# Patient Record
Sex: Female | Born: 1980
Health system: Southern US, Community
[De-identification: ages and names within clinical notes are randomized; demographics above are authoritative.]

## PROBLEM LIST (undated history)

## (undated) DIAGNOSIS — R209 Unspecified disturbances of skin sensation: Secondary | ICD-10-CM

## (undated) DIAGNOSIS — Z973 Presence of spectacles and contact lenses: Secondary | ICD-10-CM

## (undated) DIAGNOSIS — Z8679 Personal history of other diseases of the circulatory system: Secondary | ICD-10-CM

## (undated) DIAGNOSIS — Z8719 Personal history of other diseases of the digestive system: Secondary | ICD-10-CM

## (undated) DIAGNOSIS — K602 Anal fissure, unspecified: Secondary | ICD-10-CM

## (undated) DIAGNOSIS — K219 Gastro-esophageal reflux disease without esophagitis: Secondary | ICD-10-CM

## (undated) DIAGNOSIS — R0600 Dyspnea, unspecified: Secondary | ICD-10-CM

## (undated) DIAGNOSIS — R531 Weakness: Secondary | ICD-10-CM

## (undated) DIAGNOSIS — S5400XA Injury of ulnar nerve at forearm level, unspecified arm, initial encounter: Secondary | ICD-10-CM

## (undated) DIAGNOSIS — M545 Low back pain, unspecified: Secondary | ICD-10-CM

## (undated) DIAGNOSIS — F419 Anxiety disorder, unspecified: Secondary | ICD-10-CM

## (undated) DIAGNOSIS — F329 Major depressive disorder, single episode, unspecified: Secondary | ICD-10-CM

## (undated) DIAGNOSIS — Z8781 Personal history of (healed) traumatic fracture: Secondary | ICD-10-CM

## (undated) DIAGNOSIS — K649 Unspecified hemorrhoids: Secondary | ICD-10-CM

## (undated) DIAGNOSIS — F32A Depression, unspecified: Secondary | ICD-10-CM

## (undated) DIAGNOSIS — S62109A Fracture of unspecified carpal bone, unspecified wrist, initial encounter for closed fracture: Secondary | ICD-10-CM

## (undated) DIAGNOSIS — G43909 Migraine, unspecified, not intractable, without status migrainosus: Secondary | ICD-10-CM

## (undated) HISTORY — DX: Depression, unspecified: F32.A

## (undated) HISTORY — DX: Migraine, unspecified, not intractable, without status migrainosus: G43.909

## (undated) HISTORY — DX: Major depressive disorder, single episode, unspecified: F32.9

## (undated) HISTORY — PX: FRACTURE SURGERY: SHX138

## (undated) HISTORY — DX: Anxiety disorder, unspecified: F41.9

## (undated) HISTORY — PX: OTHER SURGICAL HISTORY: SHX169

---
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
## 2004-07-04 ENCOUNTER — Emergency Department (HOSPITAL_COMMUNITY): Admission: EM | Admit: 2004-07-04 | Discharge: 2004-07-04 | Payer: Self-pay | Admitting: *Deleted

## 2007-02-06 ENCOUNTER — Emergency Department (HOSPITAL_COMMUNITY): Admission: EM | Admit: 2007-02-06 | Discharge: 2007-02-06 | Payer: Self-pay | Admitting: Emergency Medicine

## 2008-04-04 ENCOUNTER — Ambulatory Visit: Payer: Self-pay | Admitting: Sports Medicine

## 2008-04-04 DIAGNOSIS — IMO0002 Reserved for concepts with insufficient information to code with codable children: Secondary | ICD-10-CM | POA: Insufficient documentation

## 2008-04-04 DIAGNOSIS — M25579 Pain in unspecified ankle and joints of unspecified foot: Secondary | ICD-10-CM | POA: Insufficient documentation

## 2008-12-21 ENCOUNTER — Emergency Department (HOSPITAL_COMMUNITY): Admission: EM | Admit: 2008-12-21 | Discharge: 2008-12-21 | Payer: Self-pay | Admitting: Family Medicine

## 2013-03-17 ENCOUNTER — Other Ambulatory Visit: Payer: Self-pay | Admitting: Family Medicine

## 2013-03-17 ENCOUNTER — Other Ambulatory Visit (HOSPITAL_COMMUNITY)
Admission: RE | Admit: 2013-03-17 | Discharge: 2013-03-17 | Disposition: A | Discharge status: Home / Self Care | Payer: BC Managed Care – PPO | Source: Ambulatory Visit | Attending: Family Medicine | Admitting: Family Medicine

## 2013-03-17 DIAGNOSIS — Z113 Encounter for screening for infections with a predominantly sexual mode of transmission: Secondary | ICD-10-CM | POA: Insufficient documentation

## 2013-03-17 DIAGNOSIS — Z124 Encounter for screening for malignant neoplasm of cervix: Secondary | ICD-10-CM | POA: Insufficient documentation

## 2014-03-01 ENCOUNTER — Ambulatory Visit (INDEPENDENT_AMBULATORY_CARE_PROVIDER_SITE_OTHER): Payer: BC Managed Care – PPO | Admitting: Neurology

## 2014-03-01 ENCOUNTER — Ambulatory Visit (INDEPENDENT_AMBULATORY_CARE_PROVIDER_SITE_OTHER): Payer: Self-pay

## 2014-03-01 DIAGNOSIS — R209 Unspecified disturbances of skin sensation: Secondary | ICD-10-CM

## 2014-03-01 DIAGNOSIS — R202 Paresthesia of skin: Secondary | ICD-10-CM

## 2014-03-01 DIAGNOSIS — IMO0002 Reserved for concepts with insufficient information to code with codable children: Secondary | ICD-10-CM

## 2014-03-01 DIAGNOSIS — Z0289 Encounter for other administrative examinations: Secondary | ICD-10-CM

## 2014-03-01 NOTE — Procedures (Signed)
   NCS (NERVE CONDUCTION STUDY) WITH EMG (ELECTROMYOGRAPHY) REPORT   STUDY DATE: July 2nd 2015 PATIENT NAME: Shirley Webb DOB: [birth year] MRN: [id number]    TECHNOLOGIST: Laretta Alstrom ELECTROMYOGRAPHER: Marcial Pacas M.D.  CLINICAL INFORMATION:  33 years old right-handed Caucasian female, presenting with a year history of left fourth and fifth fingers intermittent paresthesia, left elbow discomfort, subjective weakness of her right hand, recent few months, she also complains of left-sided neck pain, left shoulder discomfort  On examination: Bilateral upper extremity motor strength was normal including grip, abductor pollicis brevis, finger abduction, finger flexion. deep tendon reflex were normal and symmetric. She has left elbow Tinel sign   FINDINGS: NERVE CONDUCTION STUDY: Bilateral median, ulnar sensory and motor responses were normal  NEEDLE ELECTROMYOGRAPHY: Selected needle examination was performed at left upper extremity muscles, left cervical paraspinal muscles.  Needle examination of left pronator teres, flexor carpi ulnaris, biceps, triceps, deltoid, extensor digital communis, brachial radialis, first dorsal interossei was normal  There was no spontaneous activity at left cervical paraspinal muscles, left C5, 6, 7  IMPRESSION:   This is a normal study. There was no electrodiagnostic evidence of upper extremity neuropathy, or left cervical radiculopathy   INTERPRETING PHYSICIAN:   Marcial Pacas M.D. Ph.D. [facility] [address], [place], [place] [address] [phone]

## 2015-02-28 ENCOUNTER — Other Ambulatory Visit: Payer: Self-pay | Admitting: Registered Nurse

## 2015-02-28 ENCOUNTER — Other Ambulatory Visit (HOSPITAL_COMMUNITY)
Admission: RE | Admit: 2015-02-28 | Discharge: 2015-02-28 | Disposition: A | Discharge status: Home / Self Care | Payer: BLUE CROSS/BLUE SHIELD | Source: Ambulatory Visit | Attending: Internal Medicine | Admitting: Internal Medicine

## 2015-02-28 DIAGNOSIS — Z01419 Encounter for gynecological examination (general) (routine) without abnormal findings: Secondary | ICD-10-CM | POA: Insufficient documentation

## 2015-03-05 LAB — CYTOLOGY - PAP

## 2015-12-02 DIAGNOSIS — Z3046 Encounter for surveillance of implantable subdermal contraceptive: Secondary | ICD-10-CM | POA: Diagnosis not present

## 2015-12-18 DIAGNOSIS — M9904 Segmental and somatic dysfunction of sacral region: Secondary | ICD-10-CM | POA: Diagnosis not present

## 2015-12-18 DIAGNOSIS — M9903 Segmental and somatic dysfunction of lumbar region: Secondary | ICD-10-CM | POA: Diagnosis not present

## 2015-12-18 DIAGNOSIS — M5136 Other intervertebral disc degeneration, lumbar region: Secondary | ICD-10-CM | POA: Diagnosis not present

## 2016-01-07 DIAGNOSIS — Z30017 Encounter for initial prescription of implantable subdermal contraceptive: Secondary | ICD-10-CM | POA: Diagnosis not present

## 2016-01-07 DIAGNOSIS — Z3046 Encounter for surveillance of implantable subdermal contraceptive: Secondary | ICD-10-CM | POA: Diagnosis not present

## 2016-01-28 DIAGNOSIS — M9903 Segmental and somatic dysfunction of lumbar region: Secondary | ICD-10-CM | POA: Diagnosis not present

## 2016-01-28 DIAGNOSIS — M531 Cervicobrachial syndrome: Secondary | ICD-10-CM | POA: Diagnosis not present

## 2016-01-28 DIAGNOSIS — M5136 Other intervertebral disc degeneration, lumbar region: Secondary | ICD-10-CM | POA: Diagnosis not present

## 2016-02-28 DIAGNOSIS — E559 Vitamin D deficiency, unspecified: Secondary | ICD-10-CM | POA: Diagnosis not present

## 2016-02-28 DIAGNOSIS — Z0001 Encounter for general adult medical examination with abnormal findings: Secondary | ICD-10-CM | POA: Diagnosis not present

## 2016-03-12 DIAGNOSIS — B373 Candidiasis of vulva and vagina: Secondary | ICD-10-CM | POA: Diagnosis not present

## 2016-03-12 DIAGNOSIS — G43909 Migraine, unspecified, not intractable, without status migrainosus: Secondary | ICD-10-CM | POA: Diagnosis not present

## 2016-03-12 DIAGNOSIS — F902 Attention-deficit hyperactivity disorder, combined type: Secondary | ICD-10-CM | POA: Diagnosis not present

## 2016-03-12 DIAGNOSIS — Z0000 Encounter for general adult medical examination without abnormal findings: Secondary | ICD-10-CM | POA: Diagnosis not present

## 2016-03-16 DIAGNOSIS — M9904 Segmental and somatic dysfunction of sacral region: Secondary | ICD-10-CM | POA: Diagnosis not present

## 2016-03-16 DIAGNOSIS — M531 Cervicobrachial syndrome: Secondary | ICD-10-CM | POA: Diagnosis not present

## 2016-03-16 DIAGNOSIS — M5136 Other intervertebral disc degeneration, lumbar region: Secondary | ICD-10-CM | POA: Diagnosis not present

## 2016-05-06 DIAGNOSIS — G43709 Chronic migraine without aura, not intractable, without status migrainosus: Secondary | ICD-10-CM | POA: Diagnosis not present

## 2016-08-14 DIAGNOSIS — M542 Cervicalgia: Secondary | ICD-10-CM | POA: Diagnosis not present

## 2016-08-14 DIAGNOSIS — F329 Major depressive disorder, single episode, unspecified: Secondary | ICD-10-CM | POA: Diagnosis not present

## 2016-08-14 DIAGNOSIS — G43709 Chronic migraine without aura, not intractable, without status migrainosus: Secondary | ICD-10-CM | POA: Diagnosis not present

## 2016-08-26 DIAGNOSIS — G43909 Migraine, unspecified, not intractable, without status migrainosus: Secondary | ICD-10-CM | POA: Diagnosis not present

## 2016-09-04 DIAGNOSIS — G43909 Migraine, unspecified, not intractable, without status migrainosus: Secondary | ICD-10-CM | POA: Diagnosis not present

## 2016-09-11 DIAGNOSIS — G43909 Migraine, unspecified, not intractable, without status migrainosus: Secondary | ICD-10-CM | POA: Diagnosis not present

## 2016-09-15 DIAGNOSIS — G43909 Migraine, unspecified, not intractable, without status migrainosus: Secondary | ICD-10-CM | POA: Diagnosis not present

## 2016-09-25 DIAGNOSIS — G43909 Migraine, unspecified, not intractable, without status migrainosus: Secondary | ICD-10-CM | POA: Diagnosis not present

## 2016-10-02 DIAGNOSIS — G43909 Migraine, unspecified, not intractable, without status migrainosus: Secondary | ICD-10-CM | POA: Diagnosis not present

## 2016-10-06 DIAGNOSIS — G43909 Migraine, unspecified, not intractable, without status migrainosus: Secondary | ICD-10-CM | POA: Diagnosis not present

## 2016-10-08 DIAGNOSIS — F902 Attention-deficit hyperactivity disorder, combined type: Secondary | ICD-10-CM | POA: Diagnosis not present

## 2016-10-19 DIAGNOSIS — G43909 Migraine, unspecified, not intractable, without status migrainosus: Secondary | ICD-10-CM | POA: Diagnosis not present

## 2016-11-13 DIAGNOSIS — G43909 Migraine, unspecified, not intractable, without status migrainosus: Secondary | ICD-10-CM | POA: Diagnosis not present

## 2016-11-20 DIAGNOSIS — G43909 Migraine, unspecified, not intractable, without status migrainosus: Secondary | ICD-10-CM | POA: Diagnosis not present

## 2016-11-27 DIAGNOSIS — G43909 Migraine, unspecified, not intractable, without status migrainosus: Secondary | ICD-10-CM | POA: Diagnosis not present

## 2016-12-18 DIAGNOSIS — G4700 Insomnia, unspecified: Secondary | ICD-10-CM | POA: Diagnosis not present

## 2016-12-18 DIAGNOSIS — F902 Attention-deficit hyperactivity disorder, combined type: Secondary | ICD-10-CM | POA: Diagnosis not present

## 2017-01-03 ENCOUNTER — Observation Stay (HOSPITAL_COMMUNITY)
Admission: EM | Admit: 2017-01-03 | Discharge: 2017-01-04 | Disposition: A | Discharge status: Home / Self Care | Payer: BLUE CROSS/BLUE SHIELD | Attending: Surgery | Admitting: Surgery

## 2017-01-03 ENCOUNTER — Emergency Department (HOSPITAL_COMMUNITY): Payer: BLUE CROSS/BLUE SHIELD

## 2017-01-03 ENCOUNTER — Encounter (HOSPITAL_COMMUNITY): Payer: Self-pay | Admitting: Surgery

## 2017-01-03 ENCOUNTER — Emergency Department (HOSPITAL_COMMUNITY): Payer: BLUE CROSS/BLUE SHIELD | Admitting: Anesthesiology

## 2017-01-03 ENCOUNTER — Encounter (HOSPITAL_COMMUNITY)
Admission: EM | Disposition: A | Discharge status: Home / Self Care | Payer: Self-pay | Source: Home / Self Care | Attending: Emergency Medicine

## 2017-01-03 DIAGNOSIS — Z882 Allergy status to sulfonamides status: Secondary | ICD-10-CM | POA: Diagnosis not present

## 2017-01-03 DIAGNOSIS — Z91040 Latex allergy status: Secondary | ICD-10-CM | POA: Diagnosis not present

## 2017-01-03 DIAGNOSIS — R1031 Right lower quadrant pain: Secondary | ICD-10-CM | POA: Diagnosis not present

## 2017-01-03 DIAGNOSIS — R509 Fever, unspecified: Secondary | ICD-10-CM | POA: Diagnosis not present

## 2017-01-03 DIAGNOSIS — K3580 Unspecified acute appendicitis: Principal | ICD-10-CM | POA: Diagnosis present

## 2017-01-03 DIAGNOSIS — R109 Unspecified abdominal pain: Secondary | ICD-10-CM | POA: Diagnosis not present

## 2017-01-03 DIAGNOSIS — K353 Acute appendicitis with localized peritonitis: Secondary | ICD-10-CM | POA: Insufficient documentation

## 2017-01-03 DIAGNOSIS — K3589 Other acute appendicitis without perforation or gangrene: Secondary | ICD-10-CM

## 2017-01-03 DIAGNOSIS — M792 Neuralgia and neuritis, unspecified: Secondary | ICD-10-CM | POA: Diagnosis not present

## 2017-01-03 HISTORY — PX: LAPAROSCOPIC APPENDECTOMY: SHX408

## 2017-01-03 LAB — COMPREHENSIVE METABOLIC PANEL
ALBUMIN: 4.1 g/dL (ref 3.5–5.0)
ALK PHOS: 50 U/L (ref 38–126)
ALT: 13 U/L — ABNORMAL LOW (ref 14–54)
AST: 17 U/L (ref 15–41)
Anion gap: 9 (ref 5–15)
BILIRUBIN TOTAL: 0.8 mg/dL (ref 0.3–1.2)
BUN: 6 mg/dL (ref 6–20)
CALCIUM: 9.1 mg/dL (ref 8.9–10.3)
CO2: 23 mmol/L (ref 22–32)
CREATININE: 0.58 mg/dL (ref 0.44–1.00)
Chloride: 104 mmol/L (ref 101–111)
GFR calc Af Amer: >60 mL/min (ref >60)
GFR calc non Af Amer: >60 mL/min (ref >60)
GLUCOSE: 128 mg/dL — AB (ref 65–99)
Potassium: 3.8 mmol/L (ref 3.5–5.1)
Sodium: 136 mmol/L (ref 135–145)
TOTAL PROTEIN: 7.2 g/dL (ref 6.5–8.1)

## 2017-01-03 LAB — CBC
HEMATOCRIT: 39.9 % (ref 36.0–46.0)
HEMOGLOBIN: 13.5 g/dL (ref 12.0–15.0)
MCH: 30.8 pg (ref 26.0–34.0)
MCHC: 33.8 g/dL (ref 30.0–36.0)
MCV: 91.1 fL (ref 78.0–100.0)
Platelets: 249 10*3/uL (ref 150–400)
RBC: 4.38 MIL/uL (ref 3.87–5.11)
RDW: 12.8 % (ref 11.5–15.5)
WBC: 15.4 10*3/uL — AB (ref 4.0–10.5)

## 2017-01-03 LAB — URINALYSIS, ROUTINE W REFLEX MICROSCOPIC
Bilirubin Urine: NEGATIVE
GLUCOSE, UA: NEGATIVE mg/dL
Hgb urine dipstick: NEGATIVE
KETONES UR: 80 mg/dL — AB
LEUKOCYTES UA: NEGATIVE
NITRITE: NEGATIVE
PH: 6 (ref 5.0–8.0)
Protein, ur: NEGATIVE mg/dL
SPECIFIC GRAVITY, URINE: 1.021 (ref 1.005–1.030)

## 2017-01-03 LAB — PREGNANCY, URINE: Preg Test, Ur: NEGATIVE

## 2017-01-03 LAB — LIPASE, BLOOD: Lipase: 21 U/L (ref 11–51)

## 2017-01-03 SURGERY — APPENDECTOMY, LAPAROSCOPIC
Anesthesia: General | Site: Abdomen

## 2017-01-03 MED ORDER — HYDROMORPHONE HCL 1 MG/ML IJ SOLN: 0.2500 mg | INTRAMUSCULAR | Status: DC | PRN

## 2017-01-03 MED ORDER — SCOPOLAMINE 1 MG/3DAYS TD PT72
MEDICATED_PATCH | TRANSDERMAL | Status: AC
  Filled 2017-01-03: qty 1

## 2017-01-03 MED ORDER — ONDANSETRON HCL 4 MG/2ML IJ SOLN
INTRAMUSCULAR | Status: AC
Start: 2017-01-03 — End: 2017-01-03
  Filled 2017-01-03: qty 2

## 2017-01-03 MED ORDER — HYDROMORPHONE HCL 1 MG/ML IJ SOLN
1.0000 mg | INTRAMUSCULAR | Status: DC | PRN
  Administered 2017-01-03: 1 mg via INTRAVENOUS
  Filled 2017-01-03: qty 1

## 2017-01-03 MED ORDER — 0.9 % SODIUM CHLORIDE (POUR BTL) OPTIME
TOPICAL | Status: DC | PRN
  Administered 2017-01-03: 1000 mL

## 2017-01-03 MED ORDER — DEXAMETHASONE SODIUM PHOSPHATE 10 MG/ML IJ SOLN
INTRAMUSCULAR | Status: DC | PRN
  Administered 2017-01-03: 10 mg via INTRAVENOUS

## 2017-01-03 MED ORDER — TRAZODONE HCL 50 MG PO TABS
50.0000 mg | ORAL_TABLET | Freq: Every day | ORAL | Status: DC
  Administered 2017-01-03: 50 mg via ORAL
  Filled 2017-01-03: qty 1

## 2017-01-03 MED ORDER — LACTATED RINGERS IR SOLN
Status: DC | PRN
  Administered 2017-01-03: 3000 mL

## 2017-01-03 MED ORDER — FENTANYL CITRATE (PF) 250 MCG/5ML IJ SOLN
INTRAMUSCULAR | Status: AC
  Filled 2017-01-03: qty 5

## 2017-01-03 MED ORDER — IMIPRAMINE HCL 50 MG PO TABS
75.0000 mg | ORAL_TABLET | Freq: Every day | ORAL | Status: DC
  Administered 2017-01-03: 75 mg via ORAL
  Filled 2017-01-03: qty 1

## 2017-01-03 MED ORDER — MORPHINE SULFATE (PF) 4 MG/ML IV SOLN
4.0000 mg | Freq: Once | INTRAVENOUS | Status: AC
  Administered 2017-01-03: 4 mg via INTRAVENOUS
  Filled 2017-01-03: qty 1

## 2017-01-03 MED ORDER — SODIUM CHLORIDE 0.9 % IV BOLUS (SEPSIS)
1000.0000 mL | Freq: Once | INTRAVENOUS | Status: AC
  Administered 2017-01-03: 1000 mL via INTRAVENOUS

## 2017-01-03 MED ORDER — BUPIVACAINE HCL (PF) 0.25 % IJ SOLN
INTRAMUSCULAR | Status: DC | PRN
  Administered 2017-01-03: 20 mL

## 2017-01-03 MED ORDER — LACTATED RINGERS IV SOLN
INTRAVENOUS | Status: DC | PRN
  Administered 2017-01-03: 11:00:00 via INTRAVENOUS

## 2017-01-03 MED ORDER — PROPOFOL 10 MG/ML IV BOLUS
INTRAVENOUS | Status: AC
  Filled 2017-01-03: qty 20

## 2017-01-03 MED ORDER — HYDROCODONE-ACETAMINOPHEN 5-325 MG PO TABS
1.0000 | ORAL_TABLET | ORAL | Status: DC | PRN
Start: 2017-01-03 — End: 2017-01-04

## 2017-01-03 MED ORDER — MIDAZOLAM HCL 2 MG/2ML IJ SOLN
INTRAMUSCULAR | Status: AC
Start: 2017-01-03 — End: 2017-01-03
  Filled 2017-01-03: qty 2

## 2017-01-03 MED ORDER — MIDAZOLAM HCL 5 MG/5ML IJ SOLN
INTRAMUSCULAR | Status: DC | PRN
  Administered 2017-01-03: 2 mg via INTRAVENOUS

## 2017-01-03 MED ORDER — ONDANSETRON HCL 4 MG/2ML IJ SOLN
4.0000 mg | Freq: Once | INTRAMUSCULAR | Status: AC
  Administered 2017-01-03: 4 mg via INTRAVENOUS
  Filled 2017-01-03: qty 2

## 2017-01-03 MED ORDER — METRONIDAZOLE IN NACL 5-0.79 MG/ML-% IV SOLN
500.0000 mg | Freq: Once | INTRAVENOUS | Status: AC
  Administered 2017-01-03: 500 mg via INTRAVENOUS
  Filled 2017-01-03: qty 100

## 2017-01-03 MED ORDER — PROPOFOL 10 MG/ML IV BOLUS
INTRAVENOUS | Status: DC | PRN
Start: 2017-01-03 — End: 2017-01-03
  Administered 2017-01-03: 140 mg via INTRAVENOUS

## 2017-01-03 MED ORDER — LIDOCAINE 2% (20 MG/ML) 5 ML SYRINGE
INTRAMUSCULAR | Status: AC
  Filled 2017-01-03: qty 5

## 2017-01-03 MED ORDER — SUGAMMADEX SODIUM 200 MG/2ML IV SOLN
INTRAVENOUS | Status: DC | PRN
  Administered 2017-01-03: 110 mg via INTRAVENOUS

## 2017-01-03 MED ORDER — HYDROMORPHONE HCL 1 MG/ML IJ SOLN
INTRAMUSCULAR | Status: DC | PRN
  Administered 2017-01-03: 0.5 mg via INTRAVENOUS
  Administered 2017-01-03: 1 mg via INTRAVENOUS
  Administered 2017-01-03: 0.5 mg via INTRAVENOUS

## 2017-01-03 MED ORDER — ONDANSETRON HCL 4 MG/2ML IJ SOLN: 4.0000 mg | Freq: Four times a day (QID) | INTRAMUSCULAR | Status: DC | PRN

## 2017-01-03 MED ORDER — LIDOCAINE 2% (20 MG/ML) 5 ML SYRINGE
INTRAMUSCULAR | Status: DC | PRN
  Administered 2017-01-03: 50 mg via INTRAVENOUS

## 2017-01-03 MED ORDER — ACETAMINOPHEN 325 MG PO TABS
650.0000 mg | ORAL_TABLET | Freq: Four times a day (QID) | ORAL | Status: DC | PRN
  Administered 2017-01-04 (×2): 650 mg via ORAL
  Filled 2017-01-03 (×2): qty 2

## 2017-01-03 MED ORDER — DEXTROSE 5 % IV SOLN
2.0000 g | INTRAVENOUS | Status: DC
  Administered 2017-01-04: 2 g via INTRAVENOUS
  Filled 2017-01-03: qty 2

## 2017-01-03 MED ORDER — ROCURONIUM BROMIDE 50 MG/5ML IV SOSY
PREFILLED_SYRINGE | INTRAVENOUS | Status: AC
  Filled 2017-01-03: qty 5

## 2017-01-03 MED ORDER — KCL IN DEXTROSE-NACL 20-5-0.45 MEQ/L-%-% IV SOLN
INTRAVENOUS | Status: DC
  Administered 2017-01-03: 14:00:00 via INTRAVENOUS
  Filled 2017-01-03: qty 1000

## 2017-01-03 MED ORDER — DEXAMETHASONE SODIUM PHOSPHATE 10 MG/ML IJ SOLN
INTRAMUSCULAR | Status: AC
  Filled 2017-01-03: qty 1

## 2017-01-03 MED ORDER — FENTANYL CITRATE (PF) 100 MCG/2ML IJ SOLN
INTRAMUSCULAR | Status: DC | PRN
  Administered 2017-01-03: 100 ug via INTRAVENOUS
  Administered 2017-01-03 (×3): 50 ug via INTRAVENOUS

## 2017-01-03 MED ORDER — BUPIVACAINE HCL (PF) 0.25 % IJ SOLN
INTRAMUSCULAR | Status: AC
  Filled 2017-01-03: qty 30

## 2017-01-03 MED ORDER — ACETAMINOPHEN 650 MG RE SUPP: 650.0000 mg | Freq: Four times a day (QID) | RECTAL | Status: DC | PRN

## 2017-01-03 MED ORDER — ROCURONIUM BROMIDE 10 MG/ML (PF) SYRINGE
PREFILLED_SYRINGE | INTRAVENOUS | Status: DC | PRN
  Administered 2017-01-03: 30 mg via INTRAVENOUS

## 2017-01-03 MED ORDER — HYDROMORPHONE HCL 2 MG/ML IJ SOLN
INTRAMUSCULAR | Status: AC
  Filled 2017-01-03: qty 1

## 2017-01-03 MED ORDER — CHLORHEXIDINE GLUCONATE CLOTH 2 % EX PADS: 6.0000 | MEDICATED_PAD | Freq: Once | CUTANEOUS | Status: DC

## 2017-01-03 MED ORDER — SUCCINYLCHOLINE CHLORIDE 200 MG/10ML IV SOSY
PREFILLED_SYRINGE | INTRAVENOUS | Status: AC
  Filled 2017-01-03: qty 10

## 2017-01-03 MED ORDER — SUCCINYLCHOLINE CHLORIDE 200 MG/10ML IV SOSY
PREFILLED_SYRINGE | INTRAVENOUS | Status: DC | PRN
  Administered 2017-01-03: 100 mg via INTRAVENOUS

## 2017-01-03 MED ORDER — PROMETHAZINE HCL 25 MG/ML IJ SOLN: 6.2500 mg | INTRAMUSCULAR | Status: DC | PRN

## 2017-01-03 MED ORDER — ONDANSETRON 4 MG PO TBDP: 4.0000 mg | ORAL_TABLET | Freq: Four times a day (QID) | ORAL | Status: DC | PRN

## 2017-01-03 MED ORDER — CEFTRIAXONE SODIUM 2 G IJ SOLR
2.0000 g | Freq: Once | INTRAMUSCULAR | Status: AC
  Administered 2017-01-03: 2 g via INTRAVENOUS
  Filled 2017-01-03: qty 2

## 2017-01-03 MED ORDER — ONDANSETRON HCL 4 MG/2ML IJ SOLN
INTRAMUSCULAR | Status: DC | PRN
  Administered 2017-01-03: 4 mg via INTRAVENOUS

## 2017-01-03 MED ORDER — IOPAMIDOL (ISOVUE-300) INJECTION 61%
INTRAVENOUS | Status: AC
  Administered 2017-01-03: 100 mL via INTRAVENOUS
  Filled 2017-01-03: qty 100

## 2017-01-03 MED ORDER — LACTATED RINGERS IV SOLN: INTRAVENOUS | Status: DC

## 2017-01-03 MED ORDER — KCL IN DEXTROSE-NACL 20-5-0.45 MEQ/L-%-% IV SOLN
INTRAVENOUS | Status: AC
  Filled 2017-01-03: qty 1000

## 2017-01-03 MED ORDER — METRONIDAZOLE IN NACL 5-0.79 MG/ML-% IV SOLN
500.0000 mg | Freq: Three times a day (TID) | INTRAVENOUS | Status: DC
  Administered 2017-01-03 – 2017-01-04 (×3): 500 mg via INTRAVENOUS
  Filled 2017-01-03 (×3): qty 100

## 2017-01-03 MED ORDER — SUGAMMADEX SODIUM 200 MG/2ML IV SOLN
INTRAVENOUS | Status: AC
  Filled 2017-01-03: qty 2

## 2017-01-03 SURGICAL SUPPLY — 37 items
APPLIER CLIP ROT 10 11.4 M/L (STAPLE)
APR CLP MED LRG 11.4X10 (STAPLE)
BAG SPEC RTRVL LRG 6X4 10 (ENDOMECHANICALS) ×1
CHLORAPREP W/TINT 26ML (MISCELLANEOUS) ×2 IMPLANT
CLIP APPLIE ROT 10 11.4 M/L (STAPLE) IMPLANT
CLOSURE STERI-STRIP 1/4X4 (GAUZE/BANDAGES/DRESSINGS) ×1 IMPLANT
COVER SURGICAL LIGHT HANDLE (MISCELLANEOUS) ×2 IMPLANT
CUTTER FLEX LINEAR 45M (STAPLE) ×1 IMPLANT
DECANTER SPIKE VIAL GLASS SM (MISCELLANEOUS) ×2 IMPLANT
DRAPE LAPAROSCOPIC ABDOMINAL (DRAPES) ×2 IMPLANT
ELECT REM PT RETURN 15FT ADLT (MISCELLANEOUS) ×2 IMPLANT
ENDOLOOP SUT PDS II  0 18 (SUTURE)
ENDOLOOP SUT PDS II 0 18 (SUTURE) IMPLANT
GAUZE SPONGE 2X2 8PLY STRL LF (GAUZE/BANDAGES/DRESSINGS) IMPLANT
GLOVE SURG ORTHO 8.0 STRL STRW (GLOVE) ×2 IMPLANT
GOWN STRL REUS W/TWL XL LVL3 (GOWN DISPOSABLE) ×4 IMPLANT
IRRIG SUCT STRYKERFLOW 2 WTIP (MISCELLANEOUS) ×2
IRRIGATION SUCT STRKRFLW 2 WTP (MISCELLANEOUS) ×1 IMPLANT
KIT BASIN OR (CUSTOM PROCEDURE TRAY) ×2 IMPLANT
POUCH SPECIMEN RETRIEVAL 10MM (ENDOMECHANICALS) ×2 IMPLANT
RELOAD 45 VASCULAR/THIN (ENDOMECHANICALS) IMPLANT
RELOAD STAPLE 45 2.5 WHT GRN (ENDOMECHANICALS) IMPLANT
RELOAD STAPLE 45 3.5 BLU ETS (ENDOMECHANICALS) IMPLANT
RELOAD STAPLE TA45 3.5 REG BLU (ENDOMECHANICALS) ×2 IMPLANT
SHEARS HARMONIC ACE PLUS 36CM (ENDOMECHANICALS) ×2 IMPLANT
SPONGE GAUZE 2X2 STER 10/PKG (GAUZE/BANDAGES/DRESSINGS) ×1
STRIP CLOSURE SKIN 1/2X4 (GAUZE/BANDAGES/DRESSINGS) ×2 IMPLANT
SUT MNCRL AB 4-0 PS2 18 (SUTURE) ×2 IMPLANT
TAPE CLOTH SURG 4X10 WHT LF (GAUZE/BANDAGES/DRESSINGS) ×1 IMPLANT
TOWEL OR 17X26 10 PK STRL BLUE (TOWEL DISPOSABLE) ×2 IMPLANT
TOWEL OR NON WOVEN STRL DISP B (DISPOSABLE) ×2 IMPLANT
TRAY FOLEY METER SIL LF 16FR (CATHETERS) ×1 IMPLANT
TRAY FOLEY W/METER SILVER 14FR (SET/KITS/TRAYS/PACK) IMPLANT
TRAY FOLEY W/METER SILVER 16FR (SET/KITS/TRAYS/PACK) IMPLANT
TRAY LAPAROSCOPIC (CUSTOM PROCEDURE TRAY) ×2 IMPLANT
TROCAR XCEL BLUNT TIP 100MML (ENDOMECHANICALS) ×2 IMPLANT
TROCAR XCEL NON-BLD 11X100MML (ENDOMECHANICALS) ×2 IMPLANT

## 2017-01-03 NOTE — ED Provider Notes (Signed)
[place] [facility] DEPT Provider Note   CSN: [id number] Arrival date & time: 01/03/17  0303     History   Chief Complaint No chief complaint on file.   HPI Shirley Webb is a 36 y.o. female who is previously healthy who presents with a 1 day history of lower abdominal pain. Patient reports her pain is the worst in the right, but also in the suprapubic area. Patient describes her pain as severe, like that menstrual cramps. However, patient states she does not get menstrual cycle due to her Nexplanon implant. Patient has had associated nausea, vomiting, and one episode of diarrhea. No known blood in her stools. Patient reports her pain was initially coming in waves, but now it is constant. Patient has taken Pepto-Bismol, Zantac, Excedrin at home without relief. Patient reports having a temperature of around 99 at home. Patient denies any chest pain, shortness of breath, vaginal discharge or bleeding, pelvic pain, urinary symptoms.  HPI  No past medical history on file.  Patient Active Problem List   Diagnosis Date Noted  . Paresthesia 03/01/2014  . ANKLE, PAIN 04/04/2008  . NEURALGIA 04/04/2008    No past surgical history on file.  OB History    No data available       Home Medications    Prior to Admission medications   Medication Sig Start Date End Date Taking? Authorizing Provider  aspirin-acetaminophen-caffeine (EXCEDRIN MIGRAINE) [phone] MG tablet Take 2 tablets by mouth every 6 (six) hours as needed for headache.   Yes [provider]  baclofen (LIORESAL) 10 MG tablet Take 10 mg by mouth as needed for migraine. 09/28/16 09/28/17 Yes [provider]  Calcium Carbonate-Vitamin D (CALCIUM 500 + D) 500-125 MG-UNIT TABS Take 1 tablet by mouth daily.   Yes [provider]  dextroamphetamine (DEXTROSTAT) 5 MG tablet Take 5 mg by mouth 2 (two) times daily.   Yes [provider]  diphenhydrAMINE (BENADRYL) 25 MG tablet Take 50 mg by mouth every  6 (six) hours as needed for sleep.   Yes [provider]  ELDERBERRY PO Take 5 mLs by mouth daily.   Yes [provider]  imipramine (TOFRANIL) 25 MG tablet Take 75 mg by mouth at bedtime. 10/19/16  Yes [provider]  Multiple Vitamin (MULTIVITAMIN) tablet Take 1 tablet by mouth daily.   Yes [provider]  Rhodiola rosea (RHODIOLA PO) Take 1 vial by mouth.   Yes [provider]  traZODone (DESYREL) 50 MG tablet Take 50-100 mg by mouth at bedtime.    Yes [provider]    Family History No family history on file.  Social History Social History  Substance Use Topics  . Smoking status: Not on file  . Smokeless tobacco: Not on file  . Alcohol use Not on file     Allergies   Sulfamethoxazole-trimethoprim and Latex   Review of Systems Review of Systems  Constitutional: Negative for chills and fever.  HENT: Negative for facial swelling and sore throat.   Respiratory: Negative for shortness of breath.   Cardiovascular: Negative for chest pain.  Gastrointestinal: Positive for abdominal pain, diarrhea, nausea and vomiting. Negative for blood in stool.  Genitourinary: Negative for dysuria.  Musculoskeletal: Negative for back pain.  Skin: Negative for rash and wound.  Neurological: Negative for headaches.  Psychiatric/Behavioral: The patient is not nervous/anxious.      Physical Exam Updated Vital Signs BP 124/73   Pulse 94   Temp 98 F (36.7 C) (Oral)  Resp 15   SpO2 100%   Physical Exam  Constitutional: She appears well-developed and well-nourished. No distress.  HENT:  Head: Normocephalic and atraumatic.  Mouth/Throat: Oropharynx is clear and moist. No oropharyngeal exudate.  Eyes: Conjunctivae are normal. Pupils are equal, round, and reactive to light. Right eye exhibits no discharge. Left eye exhibits no discharge. No scleral icterus.  Neck: Normal range of motion. Neck supple. No thyromegaly present.    Cardiovascular: Normal rate, regular rhythm, normal heart sounds and intact distal pulses.  Exam reveals no gallop and no friction rub.   No murmur heard. Pulmonary/Chest: Effort normal and breath sounds normal. No stridor. No respiratory distress. She has no wheezes. She has no rales.  Abdominal: Soft. Bowel sounds are normal. She exhibits no distension. There is generalized tenderness and tenderness in the right lower quadrant and suprapubic area. There is guarding and tenderness at McBurney's point. There is no rebound and no CVA tenderness.  Positive Rovsing sign  Musculoskeletal: She exhibits no edema.  Lymphadenopathy:    She has no cervical adenopathy.  Neurological: She is alert. Coordination normal.  Skin: Skin is warm and dry. No rash noted. She is not diaphoretic. No pallor.  Psychiatric: She has a normal mood and affect.  Nursing note and vitals reviewed.    ED Treatments / Results  Labs (all labs ordered are listed, but only abnormal results are displayed) Labs Reviewed  COMPREHENSIVE METABOLIC PANEL - Abnormal; Notable for the following:       Result Value   Glucose, Bld 128 (*)    ALT 13 (*)    All other components within normal limits  CBC - Abnormal; Notable for the following:    WBC 15.4 (*)    All other components within normal limits  URINALYSIS, ROUTINE W REFLEX MICROSCOPIC - Abnormal; Notable for the following:    Color, Urine AMBER (*)    APPearance HAZY (*)    Ketones, ur 80 (*)    All other components within normal limits  LIPASE, BLOOD  PREGNANCY, URINE    EKG  EKG Interpretation None       Radiology Ct Abdomen Pelvis W Contrast  Result Date: 01/03/2017 CLINICAL DATA:  Lower abdominal pain and tenderness. EXAM: CT ABDOMEN AND PELVIS WITH CONTRAST TECHNIQUE: Multidetector CT imaging of the abdomen and pelvis was performed using the standard protocol following bolus administration of intravenous contrast. CONTRAST:  138mL ISOVUE-300 IOPAMIDOL  (ISOVUE-300) INJECTION 61% COMPARISON:  None. FINDINGS: Lower chest: No acute abnormality. Hepatobiliary: No focal liver abnormality is seen. No gallstones, gallbladder wall thickening, or biliary dilatation. Pancreas: Unremarkable. No pancreatic ductal dilatation or surrounding inflammatory changes. Spleen: Normal in size without focal abnormality. Adrenals/Urinary Tract: Adrenal glands are unremarkable. Kidneys are normal, without renal calculi, focal lesion, or hydronephrosis. Bladder is unremarkable. Stomach/Bowel: There is evidence of acute appendicitis with a dilated and inflamed appendix present in the right pelvis emanating from the cecum and coursing posteriorly in the pelvis. The appendix contains 2 focal areas of internal partially calcified fecaliths. There is some associated small surrounding free fluid without evidence of focal abscess or visible extraluminal air. The appendix measures approximately 13 mm in greatest caliber. The wall demonstrates hyperemia. Other bowel loops in the abdomen and pelvis are unremarkable. Vascular/Lymphatic: No enlarged lymph nodes identified. No vascular abnormalities identified. Reproductive: Uterus and bilateral adnexa are unremarkable. Other: No hernias are seen. Musculoskeletal: No acute or significant osseous findings. IMPRESSION: Evidence of acute appendicitis with dilated an inflamed appendix measuring  approximately 13 mm in greatest caliber and containing 2 focal areas of partially calcified fecaliths. There is no evidence of overt appendiceal perforation or appendiceal abscess. Electronically Signed   By: Aletta Edouard M.D.   On: 01/03/2017 09:11    Procedures Procedures (including critical care time)  Medications Ordered in ED Medications  cefTRIAXone (ROCEPHIN) 2 g in dextrose 5 % 50 mL IVPB (2 g Intravenous New Bag/Given 01/03/17 0927)    And  metroNIDAZOLE (FLAGYL) IVPB 500 mg (not administered)  sodium chloride 0.9 % bolus 1,000 mL (0 mLs  Intravenous Stopped 01/03/17 0811)  morphine 4 MG/ML injection 4 mg (4 mg Intravenous Given 01/03/17 0640)  ondansetron (ZOFRAN) injection 4 mg (4 mg Intravenous Given 01/03/17 0640)  iopamidol (ISOVUE-300) 61 % injection (100 mLs Intravenous Contrast Given 01/03/17 0835)  morphine 4 MG/ML injection 4 mg (4 mg Intravenous Given 01/03/17 0847)     Initial Impression / Assessment and Plan / ED Course  I have reviewed the triage vital signs and the nursing notes.  Pertinent labs & imaging results that were available during my care of the patient were reviewed by me and considered in my medical decision making (see chart for details).     Due to patient's significant right lower quadrant tenderness, positive Rovsing sign, white blood cell count, will proceed with CT abdomen pelvis. Patient given morphine and Zofran with 1 L fluid bolus.  9:20 AM On reevaluation, patient's pain and nausea are controlled. Patient with evidence of acute appendicitis on CT abdomen pelvis. Will consult Gen. surgery. Patient updated. Last PO intake 8pm yesterday.  CBC shows WBC 15.4K. CMP shows glucose 128, ALT 13. Lipase 21. UA shows 80 ketones. Urine pregnancy negative. CT abdomen and pelvis shows Evidence of acute appendicitis with dilated an inflamed appendix measuring approximately 13 mm in greatest caliber and containing 2 focal areas of partially calcified fecaliths. There is no evidence of overt appendiceal perforation or appendiceal abscess. I spoke with general surgeon, Dr. Harlow Asa, who evaluated patient and will take patient to OR for appendectomy today. Patient understands and agrees with plan. Patient also evaluated by Dr. Venora Maples who agrees with plan.  Final Clinical Impressions(s) / ED Diagnoses   Final diagnoses:  Other acute appendicitis    New Prescriptions New Prescriptions   No medications on file     Caryl Ada 01/03/17 1008    Jola Schmidt, MD 01/03/17 1535

## 2017-01-03 NOTE — Op Note (Signed)
OPERATIVE REPORT - LAPAROSCOPIC APPENDECTOMY  Preop diagnosis: Acute appendicitis  Postop diagnosis: Same  Procedure: Laparoscopic appendectomy  Surgeon:  Earnstine Regal, MD, FACS  Anesthesia: General endotracheal  Estimated blood loss: Minimal  Preparation: Chlora-prep  Complications: None  Indications:  patient is a 36 yo WF with 48 hour hx of abdominal pain, nausea, emesis, and low grade fever.  Pain has localized to RLQ of abdomen.  Presents to ER.  WBC elevated at 15K.  CTA positive for acute appendicitis.  General surgery called for evaluation and management.  Procedure:  Patient is brought to the operating room and placed in a supine position on the operating room table. Following administration of general anesthesia, a time out was held and the patient's name and procedure is confirmed. Patient is then prepped and draped in the usual strict aseptic fashion.  After ascertaining that an adequate level of anesthesia has been achieved, a peri-umbilical incision is made with a #15 blade. Dissection is carried down to the fascia. Fascia is incised in the midline and the peritoneal cavity is entered cautiously. A #0-vicryl pursestring suture is placed in the fascia. An Hassan cannula is introduced under direct vision and secured with the pursestring suture. The abdomen is insufflated with carbon dioxide. The laparoscope is introduced and the abdomen is explored. Operative ports are placed in the right upper quadrant and left lower quadrant. The appendix is identified. The mesoappendix is divided with the harmonic scalpel. Dissection is carried down to the base of the appendix. The base of the appendix is dissected out clearing the junction with the cecal wall. Using an Endo-GIA stapler, the base of the appendix is transected at the junction with the cecal wall. There is good approximation of tissue along the staple line. There is good hemostasis along the staple line. The appendix is placed  into an endo-catch bag and withdrawn through the umbilical port. The #0-vicryl pursestring suture is tied securely.  Right lower quadrant is irrigated with warm saline which is evacuated. Good hemostasis is noted. Ports are removed under direct vision. Good hemostasis is noted at the port sites. Pneumoperitoneum is released.  Skin incisions are anesthetized with local anesthetic. Wounds are closed with interrupted 4-0 Monocryl subcuticular sutures. Wounds are washed and dried and benzoin and Steri-Strips are applied. Dressings are applied. The patient is awakened from anesthesia and brought to the recovery room. The patient tolerated the procedure well.  Earnstine Regal, MD, [facility] Office: [phone]

## 2017-01-03 NOTE — Transfer of Care (Signed)
Immediate Anesthesia Transfer of Care Note  Patient: Shirley Webb  Procedure(s) Performed: Procedure(s): APPENDECTOMY LAPAROSCOPIC (N/A)  Patient Location: PACU  Anesthesia Type:General  Level of Consciousness:  sedated, patient cooperative and responds to stimulation  Airway & Oxygen Therapy:Patient Spontanous Breathing and Patient connected to face mask oxgen  Post-op Assessment:  Report given to PACU RN and Post -op Vital signs reviewed and stable  Post vital signs:  Reviewed and stable  Last Vitals:  Vitals:   01/03/17 1000 01/03/17 1214  BP: (!) 95/51 (!) (P) 95/37  Pulse: 97 (P) 90  Resp:  (P) 12  Temp:  (P) 21.1 C    Complications: No apparent anesthesia complications

## 2017-01-03 NOTE — Anesthesia Postprocedure Evaluation (Addendum)
Anesthesia Post Note  Patient: Shirley Webb  Procedure(s) Performed: Procedure(s) (LRB): APPENDECTOMY LAPAROSCOPIC (N/A)  Patient location during evaluation: PACU Anesthesia Type: General Level of consciousness: sedated Pain management: pain level controlled Vital Signs Assessment: post-procedure vital signs reviewed and stable Respiratory status: spontaneous breathing and respiratory function stable Cardiovascular status: stable Anesthetic complications: no       Last Vitals:  Vitals:   01/03/17 1315 01/03/17 1336  BP: 97/69 108/63  Pulse:  82  Resp:  14  Temp:  36.7 C    Last Pain:  Vitals:   01/03/17 0958  TempSrc:   PainSc: 8                  Cherity Blickenstaff DANIEL

## 2017-01-03 NOTE — Anesthesia Procedure Notes (Signed)
Procedure Name: Intubation Date/Time: 01/03/2017 11:07 AM Performed by: Anne Fu Pre-anesthesia Checklist: Patient identified, Emergency Drugs available, Suction available, Patient being monitored and Timeout performed Patient Re-evaluated:Patient Re-evaluated prior to inductionOxygen Delivery Method: Circle system utilized Preoxygenation: Pre-oxygenation with 100% oxygen Intubation Type: IV induction Ventilation: Mask ventilation without difficulty Laryngoscope Size: Mac and 4 Grade View: Grade I Tube type: Oral Tube size: 7.0 mm Number of attempts: 1 Airway Equipment and Method: Stylet Placement Confirmation: ETT inserted through vocal cords under direct vision,  positive ETCO2,  CO2 detector and breath sounds checked- equal and bilateral Secured at: 19 cm Tube secured with: Tape Dental Injury: Teeth and Oropharynx as per pre-operative assessment

## 2017-01-03 NOTE — Anesthesia Preprocedure Evaluation (Signed)
Anesthesia Evaluation  Patient identified by MRN, date of birth, ID band Patient awake    Reviewed: Allergy & Precautions, NPO status , Patient's Chart, lab work & pertinent test results  History of Anesthesia Complications Negative for: history of anesthetic complications  Airway Mallampati: II  TM Distance: >3 FB Neck ROM: Full    Dental no notable dental hx. (+) Dental Advisory Given   Pulmonary neg pulmonary ROS,    Pulmonary exam normal        Cardiovascular negative cardio ROS Normal cardiovascular exam     Neuro/Psych negative neurological ROS  negative psych ROS   GI/Hepatic Neg liver ROS,   Endo/Other  negative endocrine ROS  Renal/GU negative Renal ROS  negative genitourinary   Musculoskeletal negative musculoskeletal ROS (+)   Abdominal   Peds negative pediatric ROS (+)  Hematology negative hematology ROS (+)   Anesthesia Other Findings   Reproductive/Obstetrics negative OB ROS                             Anesthesia Physical Anesthesia Plan  ASA: II and emergent  Anesthesia Plan: General   Post-op Pain Management:    Induction: Intravenous  Airway Management Planned: Oral ETT  Additional Equipment:   Intra-op Plan:   Post-operative Plan: Extubation in OR  Informed Consent: I have reviewed the patients History and Physical, chart, labs and discussed the procedure including the risks, benefits and alternatives for the proposed anesthesia with the patient or authorized representative who has indicated his/her understanding and acceptance.   Dental advisory given  Plan Discussed with: CRNA, Anesthesiologist and Surgeon  Anesthesia Plan Comments:         Anesthesia Quick Evaluation

## 2017-01-03 NOTE — ED Triage Notes (Signed)
Pt C/o abdominal pain since yesterday @ 11. Pain and tenderness in the lower abdomen. Pain was mild and got worse gradually. Pt had some nausea PTA. She states that it feels like period cramps but doesn't get periods due to birth control implant.

## 2017-01-03 NOTE — H&P (Signed)
Shirley Webb is an 36 y.o. female.    General Surgery [facility]  Chief Complaint: abdominal pain, acute appendicitis  HPI: patient is a 35 yo WF with 48 hour hx of abdominal pain, nausea, emesis, and low grade fever.  Pain has localized to RLQ of abdomen.  Presents to ER.  WBC elevated at 15K.  CTA positive for acute appendicitis.  General surgery called for evaluation and management.  History reviewed. No pertinent past medical history.  History reviewed. No pertinent surgical history.  History reviewed. No pertinent family history. Social History:  has no tobacco, alcohol, and drug history on file.  Allergies:  Allergies  Allergen Reactions  . Sulfamethoxazole-Trimethoprim Hives and Rash  . Latex     Latex sensitivity--usually okay with gloves and such, but once had a reaction characteristic of a latex allergy     (Not in a hospital admission)  Results for orders placed or performed during the hospital encounter of 01/03/17 (from the past 48 hour(s))  Lipase, blood     Status: None   Collection Time: 01/03/17  3:14 AM  Result Value Ref Range   Lipase 21 11 - 51 U/L  Comprehensive metabolic panel     Status: Abnormal   Collection Time: 01/03/17  3:14 AM  Result Value Ref Range   Sodium 136 135 - 145 mmol/L   Potassium 3.8 3.5 - 5.1 mmol/L   Chloride 104 101 - 111 mmol/L   CO2 23 22 - 32 mmol/L   Glucose, Bld 128 (H) 65 - 99 mg/dL   BUN 6 6 - 20 mg/dL   Creatinine, Ser 0.58 0.44 - 1.00 mg/dL   Calcium 9.1 8.9 - 10.3 mg/dL   Total Protein 7.2 6.5 - 8.1 g/dL   Albumin 4.1 3.5 - 5.0 g/dL   AST 17 15 - 41 U/L   ALT 13 (L) 14 - 54 U/L   Alkaline Phosphatase 50 38 - 126 U/L   Total Bilirubin 0.8 0.3 - 1.2 mg/dL   GFR calc non Af Amer >60 >60 mL/min   GFR calc Af Amer >60 >60 mL/min    Comment: (NOTE) The eGFR has been calculated using the CKD EPI equation. This calculation has not been validated in all clinical situations. eGFR's persistently  <60 mL/min signify possible Chronic Kidney Disease.    Anion gap 9 5 - 15  CBC     Status: Abnormal   Collection Time: 01/03/17  3:14 AM  Result Value Ref Range   WBC 15.4 (H) 4.0 - 10.5 K/uL   RBC 4.38 3.87 - 5.11 MIL/uL   Hemoglobin 13.5 12.0 - 15.0 g/dL   HCT 39.9 36.0 - 46.0 %   MCV 91.1 78.0 - 100.0 fL   MCH 30.8 26.0 - 34.0 pg   MCHC 33.8 30.0 - 36.0 g/dL   RDW 12.8 11.5 - 15.5 %   Platelets 249 150 - 400 K/uL  Urinalysis, Routine w reflex microscopic     Status: Abnormal   Collection Time: 01/03/17  6:00 AM  Result Value Ref Range   Color, Urine AMBER (A) YELLOW    Comment: BIOCHEMICALS MAY BE AFFECTED BY COLOR   APPearance HAZY (A) CLEAR   Specific Gravity, Urine 1.021 1.005 - 1.030   pH 6.0 5.0 - 8.0   Glucose, UA NEGATIVE NEGATIVE mg/dL   Hgb urine dipstick NEGATIVE NEGATIVE   Bilirubin Urine NEGATIVE NEGATIVE   Ketones, ur 80 (A) NEGATIVE mg/dL   Protein, ur  NEGATIVE NEGATIVE mg/dL   Nitrite NEGATIVE NEGATIVE   Leukocytes, UA NEGATIVE NEGATIVE  Pregnancy, urine     Status: None   Collection Time: 01/03/17  7:10 AM  Result Value Ref Range   Preg Test, Ur NEGATIVE NEGATIVE    Comment:        THE SENSITIVITY OF THIS METHODOLOGY IS >20 mIU/mL.    Ct Abdomen Pelvis W Contrast  Result Date: 01/03/2017 CLINICAL DATA:  Lower abdominal pain and tenderness. EXAM: CT ABDOMEN AND PELVIS WITH CONTRAST TECHNIQUE: Multidetector CT imaging of the abdomen and pelvis was performed using the standard protocol following bolus administration of intravenous contrast. CONTRAST:  119m ISOVUE-300 IOPAMIDOL (ISOVUE-300) INJECTION 61% COMPARISON:  None. FINDINGS: Lower chest: No acute abnormality. Hepatobiliary: No focal liver abnormality is seen. No gallstones, gallbladder wall thickening, or biliary dilatation. Pancreas: Unremarkable. No pancreatic ductal dilatation or surrounding inflammatory changes. Spleen: Normal in size without focal abnormality. Adrenals/Urinary Tract: Adrenal  glands are unremarkable. Kidneys are normal, without renal calculi, focal lesion, or hydronephrosis. Bladder is unremarkable. Stomach/Bowel: There is evidence of acute appendicitis with a dilated and inflamed appendix present in the right pelvis emanating from the cecum and coursing posteriorly in the pelvis. The appendix contains 2 focal areas of internal partially calcified fecaliths. There is some associated small surrounding free fluid without evidence of focal abscess or visible extraluminal air. The appendix measures approximately 13 mm in greatest caliber. The wall demonstrates hyperemia. Other bowel loops in the abdomen and pelvis are unremarkable. Vascular/Lymphatic: No enlarged lymph nodes identified. No vascular abnormalities identified. Reproductive: Uterus and bilateral adnexa are unremarkable. Other: No hernias are seen. Musculoskeletal: No acute or significant osseous findings. IMPRESSION: Evidence of acute appendicitis with dilated an inflamed appendix measuring approximately 13 mm in greatest caliber and containing 2 focal areas of partially calcified fecaliths. There is no evidence of overt appendiceal perforation or appendiceal abscess. Electronically Signed   By: GAletta EdouardM.D.   On: 01/03/2017 09:11    Review of Systems  Constitutional: Positive for fever. Negative for chills, diaphoresis and malaise/fatigue.  HENT: Negative.   Eyes: Negative.   Respiratory: Negative.   Cardiovascular: Negative.   Gastrointestinal: Positive for abdominal pain (localized to RLQ), nausea and vomiting.  Genitourinary: Negative.   Musculoskeletal: Negative.   Skin: Negative.   Neurological: Negative.  Negative for weakness.  Endo/Heme/Allergies: Negative.   Psychiatric/Behavioral: Negative.     Blood pressure 112/76, pulse 95, temperature 98 F (36.7 C), temperature source Oral, resp. rate 15, SpO2 100 %. Physical Exam  Constitutional: She is oriented to person, place, and time. She  appears well-developed and well-nourished. No distress.  HENT:  Head: Normocephalic and atraumatic.  Right Ear: External ear normal.  Left Ear: External ear normal.  Mouth/Throat: No oropharyngeal exudate.  Eyes: Conjunctivae are normal. Pupils are equal, round, and reactive to light. No scleral icterus.  Neck: Normal range of motion. Neck supple. No tracheal deviation present. No thyromegaly present.  Cardiovascular: Normal rate, regular rhythm and normal heart sounds.   No murmur heard. Respiratory: Effort normal and breath sounds normal. No respiratory distress. She has no wheezes.  GI: Soft. She exhibits no distension and no mass. There is tenderness (RLQ). There is guarding. There is no rebound.  Musculoskeletal: Normal range of motion. She exhibits no edema, tenderness or deformity.  Neurological: She is alert and oriented to person, place, and time.  Skin: Skin is warm and dry. She is not diaphoretic.  Psychiatric: She has a normal mood  and affect. Her behavior is normal.     Assessment/Plan Acute appendicitis  IV abx's started by ER MD  NPO  Plan lap appendectomy this AM  The risks and benefits of the procedure have been discussed at length with the patient.  The patient understands the proposed procedure, potential alternative treatments, and the course of recovery to be expected.  All of the patient's questions have been answered at this time.  The patient wishes to proceed with surgery.  Earnstine Regal, MD, [facility] Office: [phone]    Earnstine Regal, MD 01/03/2017, 9:57 AM

## 2017-01-04 ENCOUNTER — Encounter (HOSPITAL_COMMUNITY): Payer: Self-pay | Admitting: Surgery

## 2017-01-04 MED ORDER — HYDROCODONE-ACETAMINOPHEN 5-325 MG PO TABS: 1.0000 | ORAL_TABLET | ORAL | 0 refills | Status: DC | PRN

## 2017-01-04 NOTE — Discharge Summary (Signed)
[facility] Discharge Summary   Patient ID: SAI MOURA MRN: [id number] DOB/AGE: 35-Aug-1982 35 y.o.  Admit date: 01/03/2017 Discharge date: 01/04/2017  Admitting Diagnosis: appendicitis  Discharge Diagnosis Patient Active Problem List   Diagnosis Date Noted  . Appendicitis, acute 01/03/2017  . Acute appendicitis 01/03/2017  . Paresthesia 03/01/2014  . ANKLE, PAIN 04/04/2008  . NEURALGIA 04/04/2008    Consultants None  Imaging: Ct Abdomen Pelvis W Contrast  Result Date: 01/03/2017 CLINICAL DATA:  Lower abdominal pain and tenderness. EXAM: CT ABDOMEN AND PELVIS WITH CONTRAST TECHNIQUE: Multidetector CT imaging of the abdomen and pelvis was performed using the standard protocol following bolus administration of intravenous contrast. CONTRAST:  136mL ISOVUE-300 IOPAMIDOL (ISOVUE-300) INJECTION 61% COMPARISON:  None. FINDINGS: Lower chest: No acute abnormality. Hepatobiliary: No focal liver abnormality is seen. No gallstones, gallbladder wall thickening, or biliary dilatation. Pancreas: Unremarkable. No pancreatic ductal dilatation or surrounding inflammatory changes. Spleen: Normal in size without focal abnormality. Adrenals/Urinary Tract: Adrenal glands are unremarkable. Kidneys are normal, without renal calculi, focal lesion, or hydronephrosis. Bladder is unremarkable. Stomach/Bowel: There is evidence of acute appendicitis with a dilated and inflamed appendix present in the right pelvis emanating from the cecum and coursing posteriorly in the pelvis. The appendix contains 2 focal areas of internal partially calcified fecaliths. There is some associated small surrounding free fluid without evidence of focal abscess or visible extraluminal air. The appendix measures approximately 13 mm in greatest caliber. The wall demonstrates hyperemia. Other bowel loops in the abdomen and pelvis are unremarkable. Vascular/Lymphatic: No enlarged lymph nodes identified. No vascular  abnormalities identified. Reproductive: Uterus and bilateral adnexa are unremarkable. Other: No hernias are seen. Musculoskeletal: No acute or significant osseous findings. IMPRESSION: Evidence of acute appendicitis with dilated an inflamed appendix measuring approximately 13 mm in greatest caliber and containing 2 focal areas of partially calcified fecaliths. There is no evidence of overt appendiceal perforation or appendiceal abscess. Electronically Signed   By: Aletta Edouard M.D.   On: 01/03/2017 09:11    Procedures Dr. Harlow Asa (01/03/17) - Laparoscopic Appendectomy  HPI:  Hospital Course:  Maysa Lynn is a 36 year old female who presented to the ED with RLQ abdominal pain.  Workup showed appendicitis.  Patient was admitted and underwent procedure listed above.  Tolerated procedure well and was transferred to the floor.  Diet was advanced as tolerated.  On POD#1, the patient was voiding well, tolerating diet, ambulating well, pain well controlled, vital signs stable, incisions c/d/i and felt stable for discharge home.  Patient will follow up in our office in 2 weeks and knows to call with questions or concerns.  She will call to confirm appointment date/time.    Patient was discharged in good condition.  The [place] Substance controlled database was reviewed prior to prescribing narcotic pain medication to this patient.  Physical Exam: General:  Alert, NAD, pleasant, cooperative, well appearing Cardio: RRR, S1 & S2 normal, no murmur, rubs, gallops Resp: Effort normal, lungs CTA bilaterally, no wheezes, rales, rhonchi Abd:  Soft, ND, incisions C/D/I, appropriate mild generalized TTP, normal bowel sounds Skin: warm and dry   Allergies as of 01/04/2017      Reactions   Sulfamethoxazole-trimethoprim Hives, Rash   Latex    Latex sensitivity--usually okay with gloves and such, but once had a reaction characteristic of a latex allergy      Medication List    TAKE these medications    aspirin-acetaminophen-caffeine 250-250-65 MG tablet Commonly known as:  EXCEDRIN MIGRAINE  Take 2 tablets by mouth every 6 (six) hours as needed for headache.   baclofen 10 MG tablet Commonly known as:  LIORESAL Take 10 mg by mouth as needed for migraine.   CALCIUM 500 + D 500-125 MG-UNIT Tabs Generic drug:  Calcium Carbonate-Vitamin D Take 1 tablet by mouth daily.   dextroamphetamine 5 MG tablet Commonly known as:  DEXTROSTAT Take 5 mg by mouth 2 (two) times daily.   diphenhydrAMINE 25 MG tablet Commonly known as:  BENADRYL Take 50 mg by mouth every 6 (six) hours as needed for sleep.   ELDERBERRY PO Take 5 mLs by mouth daily.   HYDROcodone-acetaminophen 5-325 MG tablet Commonly known as:  NORCO/VICODIN Take 1 tablet by mouth every 4 (four) hours as needed for moderate pain.   imipramine 25 MG tablet Commonly known as:  TOFRANIL Take 75 mg by mouth at bedtime.   multivitamin tablet Take 1 tablet by mouth daily.   RHODIOLA PO Take 1 vial by mouth.   traZODone 50 MG tablet Commonly known as:  DESYREL Take 50-100 mg by mouth at bedtime.        Follow-up [facility], [place]. Call.   Specialty:  General Surgery Why:  to confirm appointment date and time Contact information: [address] [place] [phone]          Signed: [facility] 01/04/2017, 8:16 AM Pager: [phone] Consults: [phone] Mon-Fri 7:00 am-4:30 pm Sat-Sun 7:00 am-11:30 am

## 2017-01-04 NOTE — Discharge Instructions (Signed)

## 2017-01-07 DIAGNOSIS — G4700 Insomnia, unspecified: Secondary | ICD-10-CM | POA: Diagnosis not present

## 2017-01-07 DIAGNOSIS — F419 Anxiety disorder, unspecified: Secondary | ICD-10-CM | POA: Diagnosis not present

## 2017-01-08 ENCOUNTER — Encounter (HOSPITAL_COMMUNITY): Payer: Self-pay | Admitting: Surgery

## 2017-01-30 NOTE — Addendum Note (Signed)
Addendum  created 01/30/17 5974 by Duane Boston, MD   Sign clinical note

## 2017-02-22 DIAGNOSIS — F909 Attention-deficit hyperactivity disorder, unspecified type: Secondary | ICD-10-CM | POA: Diagnosis not present

## 2017-02-22 DIAGNOSIS — G4700 Insomnia, unspecified: Secondary | ICD-10-CM | POA: Diagnosis not present

## 2017-03-18 DIAGNOSIS — M542 Cervicalgia: Secondary | ICD-10-CM | POA: Diagnosis not present

## 2017-03-23 DIAGNOSIS — M542 Cervicalgia: Secondary | ICD-10-CM | POA: Diagnosis not present

## 2017-03-30 DIAGNOSIS — M542 Cervicalgia: Secondary | ICD-10-CM | POA: Diagnosis not present

## 2017-09-22 DIAGNOSIS — G4700 Insomnia, unspecified: Secondary | ICD-10-CM | POA: Diagnosis not present

## 2017-09-22 DIAGNOSIS — F909 Attention-deficit hyperactivity disorder, unspecified type: Secondary | ICD-10-CM | POA: Diagnosis not present

## 2017-10-20 DIAGNOSIS — G43709 Chronic migraine without aura, not intractable, without status migrainosus: Secondary | ICD-10-CM | POA: Diagnosis not present

## 2017-10-20 DIAGNOSIS — F419 Anxiety disorder, unspecified: Secondary | ICD-10-CM | POA: Diagnosis not present

## 2017-12-29 DIAGNOSIS — G43709 Chronic migraine without aura, not intractable, without status migrainosus: Secondary | ICD-10-CM | POA: Diagnosis not present

## 2018-03-07 DIAGNOSIS — F909 Attention-deficit hyperactivity disorder, unspecified type: Secondary | ICD-10-CM | POA: Diagnosis not present

## 2018-03-07 DIAGNOSIS — G4700 Insomnia, unspecified: Secondary | ICD-10-CM | POA: Diagnosis not present

## 2018-06-15 DIAGNOSIS — R7989 Other specified abnormal findings of blood chemistry: Secondary | ICD-10-CM | POA: Diagnosis not present

## 2018-06-15 DIAGNOSIS — G4700 Insomnia, unspecified: Secondary | ICD-10-CM | POA: Diagnosis not present

## 2018-06-15 DIAGNOSIS — F902 Attention-deficit hyperactivity disorder, combined type: Secondary | ICD-10-CM | POA: Diagnosis not present

## 2018-06-15 DIAGNOSIS — Z0000 Encounter for general adult medical examination without abnormal findings: Secondary | ICD-10-CM | POA: Diagnosis not present

## 2018-06-15 DIAGNOSIS — Z23 Encounter for immunization: Secondary | ICD-10-CM | POA: Diagnosis not present

## 2018-06-23 ENCOUNTER — Encounter: Payer: Self-pay | Admitting: Gastroenterology

## 2018-07-11 DIAGNOSIS — G43709 Chronic migraine without aura, not intractable, without status migrainosus: Secondary | ICD-10-CM | POA: Diagnosis not present

## 2018-08-01 ENCOUNTER — Ambulatory Visit: Payer: BLUE CROSS/BLUE SHIELD | Admitting: Gastroenterology

## 2018-08-03 ENCOUNTER — Other Ambulatory Visit (INDEPENDENT_AMBULATORY_CARE_PROVIDER_SITE_OTHER): Payer: BLUE CROSS/BLUE SHIELD

## 2018-08-03 ENCOUNTER — Ambulatory Visit: Payer: BLUE CROSS/BLUE SHIELD | Admitting: Gastroenterology

## 2018-08-03 ENCOUNTER — Encounter: Payer: Self-pay | Admitting: Gastroenterology

## 2018-08-03 VITALS — BP 86/60 | HR 80 | Ht 61.0 in | Wt 114.0 lb

## 2018-08-03 DIAGNOSIS — K625 Hemorrhage of anus and rectum: Secondary | ICD-10-CM | POA: Diagnosis not present

## 2018-08-03 DIAGNOSIS — K602 Anal fissure, unspecified: Secondary | ICD-10-CM

## 2018-08-03 DIAGNOSIS — K6289 Other specified diseases of anus and rectum: Secondary | ICD-10-CM

## 2018-08-03 LAB — HEMATOCRIT: HCT: 42.7 % (ref 36.0–46.0)

## 2018-08-03 LAB — HEMOGLOBIN: HEMOGLOBIN: 14.3 g/dL (ref 12.0–15.0)

## 2018-08-03 LAB — HIGH SENSITIVITY CRP: CRP HIGH SENSITIVITY: 0.18 mg/L (ref 0.000–5.000)

## 2018-08-03 MED ORDER — AMBULATORY NON FORMULARY MEDICATION: 1 refills | Status: DC

## 2018-08-03 NOTE — Patient Instructions (Addendum)
Go to the basement today for labs   You have been scheduled for a colonoscopy. Please follow written instructions given to you at your visit today.  Please pick up your prep supplies at the pharmacy within the next 1-3 days. If you use inhalers (even only as needed), please bring them with you on the day of your procedure.  We have given you a free Plenvu sample kit today  We have given you a printed prescription of Nitroglycerin ointment to take to [facility]  Use benefiber 1 teaspoon daily three times a day  Increase water intake to 64 ounces daily  Follow up in 3 months   Thank you for choosing [facility]  Karleen Hampshire Nandigam,MD

## 2018-08-03 NOTE — Progress Notes (Signed)
Shirley Webb    [id number]    [birth year]  Primary Care Physician:Prevost, Leonia Reader, FNP  Referring Physician: Tommy Medal, MD [address] [place], [place] [address]  Chief complaint: Blood per rectum  HPI:  37 year old female here with complaints of intermittent bright red blood per rectum after a bowel movement when she wipes.  She also has rectal discomfort during defecation. She had had on and off symptoms from hemorrhoids for past few years but progressivley worse in the past year. She has used multiple topical , homeopathic and herbal remedies with minimal improvement. She has rectal bleeding and discomfort almost daily with every BM. Denies severe constipation or excessive straining. She has occasional hard stool about once a month and usually improves when she drinks more water. Less formed loose stool 1-2 times a week. She is taking probiotic intermittently, renew life from [employer].  No family history of IBD, or colon cancer. Medical history significant for neuralgia, migraine, vertigo.  She takes Excedrin intermittently.   Outpatient Encounter Medications as of 08/03/2018  Medication Sig  . aspirin-acetaminophen-caffeine (EXCEDRIN MIGRAINE) 250-250-65 MG tablet Take 2 tablets by mouth every 6 (six) hours as needed for headache.  . baclofen (LIORESAL) 10 MG tablet Take 10 mg by mouth 3 (three) times daily.  . chlorzoxazone (PARAFON) 500 MG tablet Take 1 tablet by mouth every 8 (eight) hours as needed.  Marland Kitchen dextroamphetamine (DEXTROSTAT) 5 MG tablet Take 5 mg by mouth 2 (two) times daily.  . diphenhydrAMINE (BENADRYL) 25 MG tablet Take 50 mg by mouth every 6 (six) hours as needed for sleep.  Marland Kitchen ELDERBERRY PO Take 5 mLs by mouth daily.  Marland Kitchen EMGALITY 120 MG/ML SOAJ Inject into the skin every 30 (thirty) days.  . Melatonin-Lemon Balm 3-500 MG-MCG TABS Take 1 tablet by mouth at bedtime.  . Multiple Vitamin (MULTIVITAMIN) tablet Take 1 tablet by  mouth daily.  . Omega-3 Fatty Acids (FISH OIL) 1000 MG CAPS Take 1 capsule by mouth daily.  . Probiotic Product (PROBIOTIC-10) CAPS Take 1 capsule by mouth daily.  . Rhodiola rosea (RHODIOLA PO) Take 1 vial by mouth.  . rizatriptan (MAXALT) 10 MG tablet Take 1 tablet by mouth daily as needed.  . topiramate (TOPAMAX) 25 MG tablet Take 3 tablets by mouth at bedtime.  . traZODone (DESYREL) 50 MG tablet Take 50-100 mg by mouth at bedtime.   . AMBULATORY NON FORMULARY MEDICATION Medication Name: Nitroglycerin ointment 0.125% to use pea sized amount per rectum three times a day  . [DISCONTINUED] Calcium Carbonate-Vitamin D (CALCIUM 500 + D) 500-125 MG-UNIT TABS Take 1 tablet by mouth daily.  . [DISCONTINUED] HYDROcodone-acetaminophen (NORCO/VICODIN) 5-325 MG tablet Take 1 tablet by mouth every 4 (four) hours as needed for moderate pain.  . [DISCONTINUED] imipramine (TOFRANIL) 25 MG tablet Take 75 mg by mouth at bedtime.   No facility-administered encounter medications on file as of 08/03/2018.     Allergies as of 08/03/2018 - Review Complete 01/03/2017  Allergen Reaction Noted  . Sulfamethoxazole-trimethoprim Hives and Rash 05/06/2016  . Latex  01/03/2017    No past medical history on file.  Past Surgical History:  Procedure Laterality Date  . APPENDECTOMY    . LAPAROSCOPIC APPENDECTOMY N/A 01/03/2017   Procedure: APPENDECTOMY LAPAROSCOPIC;  Surgeon: Armandina Gemma, MD;  Location: [facility];  Service: General;  Laterality: N/A;    No family history on file.  Social History   Socioeconomic History  .  Marital status: Single    Spouse name: Not on file  . Number of children: Not on file  . Years of education: Not on file  . Highest education level: Not on file  Occupational History  . Occupation: Optician  Social Needs  . Financial resource strain: Not on file  . Food insecurity:    Worry: Not on file    Inability: Not on file  . Transportation needs:    Medical: Not on file     Non-medical: Not on file  Tobacco Use  . Smoking status: Former Research scientist (life sciences)  . Smokeless tobacco: Never Used  Substance and Sexual Activity  . Alcohol use: Yes    Alcohol/week: 1.0 - 2.0 standard drinks    Types: 1 - 2 Cans of beer per week  . Drug use: Yes    Types: Marijuana  . Sexual activity: Not on file  Lifestyle  . Physical activity:    Days per week: Not on file    Minutes per session: Not on file  . Stress: Not on file  Relationships  . Social connections:    Talks on phone: Not on file    Gets together: Not on file    Attends religious service: Not on file    Active member of club or organization: Not on file    Attends meetings of clubs or organizations: Not on file    Relationship status: Not on file  . Intimate partner violence:    Fear of current or ex partner: Not on file    Emotionally abused: Not on file    Physically abused: Not on file    Forced sexual activity: Not on file  Other Topics Concern  . Not on file  Social History Narrative  . Not on file      Review of systems: Review of Systems  Constitutional: Negative for fever and chills.  HENT: Positive for sinus trouble Eyes: Negative for blurred vision.  Respiratory: Negative for cough, shortness of breath and wheezing.   Cardiovascular: Negative for chest pain and palpitations.  Gastrointestinal: as per HPI Genitourinary: Negative for dysuria, urgency, frequency and hematuria.  Musculoskeletal: Negative for myalgias and joint pain, positive for back pain .  Skin: Negative for itching and rash.  Neurological: Negative for dizziness, tremors, focal weakness, seizures and loss of consciousness.  Positive for headaches Endo/Heme/Allergies: Positive for seasonal allergies.  Psychiatric/Behavioral: Negative for depression, suicidal ideas and hallucinations.  Positive for insomnia All other systems reviewed and are negative.   Physical Exam: Vitals:   08/03/18 0833  BP: (!) 86/60  Pulse: 80    Body mass index is 21.54 kg/m. Gen:      No acute distress HEENT:  EOMI, sclera anicteric Neck:     No masses; no thyromegaly Lungs:    Clear to auscultation bilaterally; normal respiratory effort CV:         Regular rate and rhythm; no murmurs Abd:      + bowel sounds; soft, non-tender; no palpable masses, no distension Ext:    No edema; adequate peripheral perfusion Skin:      Warm and dry; no rash Neuro: alert and oriented x 3 Psych: normal mood and affect Rectal exam: Normal anal sphincter tone, + anterior anal fissure with significant tenderness and small nonthrombosed external hemorrhoids Anoscopy: Not performed due to significant anal rectal tenderness  Data Reviewed:  Reviewed labs, radiology imaging, old records and pertinent past GI work up   Assessment and Plan/Recommendations:  37 year old female with complaints of intermittent rectal bleeding and anorectal discomfort, progressively worse in the past year. Rectal exam with evidence of anterior anal fissure Start Benefiber 1 teaspoon 3 times daily. Rectal nitroglycerin 0.125% small pea-sized amount 3-4 times daily for 6 to 8 weeks Avoid excessive straining We will schedule for colonoscopy to exclude IBD or neoplastic lesion. Check hemoglobin, hematocrit and CRP.  The risks and benefits as well as alternatives of endoscopic procedure(s) have been discussed and reviewed. All questions answered. The patient agrees to proceed.  Damaris Hippo , MD [phone]    CC: Tommy Medal, MD

## 2018-08-31 HISTORY — PX: COLONOSCOPY W/ POLYPECTOMY: SHX1380

## 2018-09-02 ENCOUNTER — Telehealth: Payer: Self-pay | Admitting: Gastroenterology

## 2018-09-02 ENCOUNTER — Encounter: Payer: Self-pay | Admitting: Gastroenterology

## 2018-09-02 NOTE — Telephone Encounter (Signed)
Started on Benefiber at her last office visit. She has a colonoscopy scheduled for 09/09/18. Instructions are to stop fiber supplements 5 days prior to procedure. She will use daily Miralax to prevent constipation.

## 2018-09-02 NOTE — Telephone Encounter (Signed)
Pt called wanting to ask questions about the upcoming sched colon. She is afraid that is she starts the pre diet she will not be able to have bowel movements so she is wanting to know if she can be prescribe something to help her not be backed up

## 2018-09-05 NOTE — Telephone Encounter (Signed)
Ok thanks 

## 2018-09-07 ENCOUNTER — Telehealth: Payer: Self-pay | Admitting: Gastroenterology

## 2018-09-07 NOTE — Telephone Encounter (Signed)
Pt called in and  Wanting to speak with nurse she had some questions about the upcoming procedure. She also advised if no answer on cell call work number at [phone]

## 2018-09-07 NOTE — Telephone Encounter (Signed)
Called pt. She states she has a cough and wanted to take some cough syrup and Nyquil. Informed pt it was ok to take cough syrup , but if she has a fever or any SOB or flu symptoms, to call our office.  Informed pt to drink a lot liquids also. She will call back if she has further questions. Gwyndolyn Saxon

## 2018-09-09 ENCOUNTER — Encounter: Payer: Self-pay | Admitting: Gastroenterology

## 2018-09-09 ENCOUNTER — Ambulatory Visit (AMBULATORY_SURGERY_CENTER): Payer: BLUE CROSS/BLUE SHIELD | Admitting: Gastroenterology

## 2018-09-09 VITALS — BP 90/56 | HR 69 | Temp 99.1°F | Resp 18 | Ht 61.0 in | Wt 114.0 lb

## 2018-09-09 DIAGNOSIS — Z1211 Encounter for screening for malignant neoplasm of colon: Secondary | ICD-10-CM | POA: Diagnosis not present

## 2018-09-09 DIAGNOSIS — K625 Hemorrhage of anus and rectum: Secondary | ICD-10-CM | POA: Diagnosis not present

## 2018-09-09 DIAGNOSIS — D128 Benign neoplasm of rectum: Secondary | ICD-10-CM

## 2018-09-09 MED ORDER — SODIUM CHLORIDE 0.9 % IV SOLN: 500.0000 mL | Freq: Once | INTRAVENOUS | Status: AC

## 2018-09-09 NOTE — Progress Notes (Signed)
Called to room to assist during endoscopic procedure.  Patient ID and intended procedure confirmed with present staff. Received instructions for my participation in the procedure from the performing physician.  

## 2018-09-09 NOTE — Patient Instructions (Addendum)
YOU HAD AN ENDOSCOPIC PROCEDURE TODAY AT [facility]:   Refer to the procedure report that was given to you for any specific questions about what was found during the examination.  If the procedure report does not answer your questions, please call your gastroenterologist to clarify.  If you requested that your care partner not be given the details of your procedure findings, then the procedure report has been included in a sealed envelope for you to review at your convenience later.  YOU SHOULD EXPECT: Some feelings of bloating in the abdomen. Passage of more gas than usual.  Walking can help get rid of the air that was put into your GI tract during the procedure and reduce the bloating. If you had a lower endoscopy (such as a colonoscopy or flexible sigmoidoscopy) you may notice spotting of blood in your stool or on the toilet paper. If you underwent a bowel prep for your procedure, you may not have a normal bowel movement for a few days.  Please Note:  You might notice some irritation and congestion in your nose or some drainage.  This is from the oxygen used during your procedure.  There is no need for concern and it should clear up in a day or so.  SYMPTOMS TO REPORT IMMEDIATELY:   Following lower endoscopy (colonoscopy or flexible sigmoidoscopy):  Excessive amounts of blood in the stool  Significant tenderness or worsening of abdominal pains  Swelling of the abdomen that is new, acute  Fever of 100F or higher  For urgent or emergent issues, a gastroenterologist can be reached at any hour by calling [phone].   DIET:  We do recommend a small meal at first, but then you may proceed to your regular diet.  Drink plenty of fluids but you should avoid alcoholic beverages for 24 hours.  MEDICATIONS: Continue present medications.  FOLLOW UP: Return to Dr. Woodward Ku office at next available appointment. They will call you later today to set up an appointment.  Please see  handouts given to you by your recovery nurse.  ACTIVITY:  You should plan to take it easy for the rest of today and you should NOT DRIVE or use heavy machinery until tomorrow (because of the sedation medicines used during the test).    FOLLOW UP: Our staff will call the number listed on your records the next business day following your procedure to check on you and address any questions or concerns that you may have regarding the information given to you following your procedure. If we do not reach you, we will leave a message.  However, if you are feeling well and you are not experiencing any problems, there is no need to return our call.  We will assume that you have returned to your regular daily activities without incident.  If any biopsies were taken you will be contacted by phone or by letter within the next 1-3 weeks.  Please call us at [phone] if you have not heard about the biopsies in 3 weeks.   Thank you for allowing [place] to provide for your healthcare needs today.  SIGNATURES/CONFIDENTIALITY: You and/or your care partner have signed paperwork which will be entered into your electronic medical record.  These signatures attest to the fact that that the information above on your After Visit Summary has been reviewed and is understood.  Full responsibility of the confidentiality of this discharge information lies with you and/or your care-partner.

## 2018-09-09 NOTE — Op Note (Signed)
[facility] Patient Name: Shirley Webb Procedure Date: 09/09/2018 7:56 AM MRN: [id number] Endoscopist: Mauri Pole , MD Age: 38 Referring MD:  Date of Birth: 07/02/1981 Gender: Female Account #: [id number] Procedure:                Colonoscopy Indications:              Evaluation of unexplained GI bleeding Medicines:                Monitored Anesthesia Care Procedure:                Pre-Anesthesia Assessment:                           - Prior to the procedure, a History and Physical                            was performed, and patient medications and                            allergies were reviewed. The patient's tolerance of                            previous anesthesia was also reviewed. The risks                            and benefits of the procedure and the sedation                            options and risks were discussed with the patient.                            All questions were answered, and informed consent                            was obtained. Prior Anticoagulants: The patient has                            taken no previous anticoagulant or antiplatelet                            agents. ASA Grade Assessment: II - A patient with                            mild systemic disease. After reviewing the risks                            and benefits, the patient was deemed in                            satisfactory condition to undergo the procedure.                           After obtaining informed consent, the colonoscope  was passed under direct vision. Throughout the                            procedure, the patient's blood pressure, pulse, and                            oxygen saturations were monitored continuously. The                            Colonoscope was introduced through the anus and                            advanced to the the cecum, identified by                            appendiceal orifice and  ileocecal valve. The                            colonoscopy was performed without difficulty. The                            patient tolerated the procedure well. The quality                            of the bowel preparation was excellent. The                            ileocecal valve, appendiceal orifice, and rectum                            were photographed. Scope In: 8:10:28 AM Scope Out: 8:34:27 AM Scope Withdrawal Time: 0 hours 10 minutes 15 seconds  Total Procedure Duration: 0 hours 23 minutes 59 seconds  Findings:                 The perianal and digital rectal examinations were                            normal.                           Normal mucosa was found in the entire colon.                            Biopsies were taken with a cold forceps for                            histology.                           The terminal ileum appeared normal. Biopsies were                            taken with a cold forceps for histology.  A 4 mm polyp was found in the rectum. The polyp was                            sessile. The polyp was removed with a cold snare.                            Resection and retrieval were complete.                           Non-bleeding internal hemorrhoids were found during                            retroflexion. The hemorrhoids were small. Complications:            No immediate complications. Estimated Blood Loss:     Estimated blood loss was minimal. Impression:               - Normal mucosa in the entire examined colon.                            Biopsied.                           - The examined portion of the ileum was normal.                            Biopsied.                           - One 4 mm polyp in the rectum, removed with a cold                            snare. Resected and retrieved.                           - Non-bleeding internal hemorrhoids. Recommendation:           - Patient has a contact number  available for                            emergencies. The signs and symptoms of potential                            delayed complications were discussed with the                            patient. Return to normal activities tomorrow.                            Written discharge instructions were provided to the                            patient.                           - Resume previous diet.                           -  Continue present medications.                           - Await pathology results.                           - Return to my office at the next available                            appointment. Mauri Pole, MD 09/09/2018 8:46:05 AM This report has been signed electronically.

## 2018-09-09 NOTE — Progress Notes (Signed)
To PACU, VSS. Report to [email or web address] 

## 2018-09-12 ENCOUNTER — Telehealth: Payer: Self-pay | Admitting: *Deleted

## 2018-09-12 NOTE — Telephone Encounter (Signed)
  Follow up Call-  Call back number 09/09/2018  Post procedure Call Back phone  # [phone]  Permission to leave phone message Yes  Some recent data might be hidden     Patient questions:  Do you have a fever, pain , or abdominal swelling? No. Pain Score  0 *  Have you tolerated food without any problems? Yes.    Have you been able to return to your normal activities? Yes.    Do you have any questions about your discharge instructions: Diet   No. Medications  No. Follow up visit  No.  Do you have questions or concerns about your Care? No.  Actions: * If pain score is 4 or above: No action needed, pain <4.

## 2018-09-14 DIAGNOSIS — J069 Acute upper respiratory infection, unspecified: Secondary | ICD-10-CM | POA: Diagnosis not present

## 2018-09-20 ENCOUNTER — Encounter: Payer: Self-pay | Admitting: Gastroenterology

## 2018-09-29 DIAGNOSIS — Z1151 Encounter for screening for human papillomavirus (HPV): Secondary | ICD-10-CM | POA: Diagnosis not present

## 2018-09-29 DIAGNOSIS — Z01419 Encounter for gynecological examination (general) (routine) without abnormal findings: Secondary | ICD-10-CM | POA: Diagnosis not present

## 2018-10-05 DIAGNOSIS — Z3046 Encounter for surveillance of implantable subdermal contraceptive: Secondary | ICD-10-CM | POA: Diagnosis not present

## 2018-10-05 DIAGNOSIS — Z6820 Body mass index (BMI) 20.0-20.9, adult: Secondary | ICD-10-CM | POA: Diagnosis not present

## 2018-10-10 ENCOUNTER — Encounter: Payer: Self-pay | Admitting: Gastroenterology

## 2018-10-10 ENCOUNTER — Ambulatory Visit (INDEPENDENT_AMBULATORY_CARE_PROVIDER_SITE_OTHER): Payer: BLUE CROSS/BLUE SHIELD | Admitting: Gastroenterology

## 2018-10-10 ENCOUNTER — Encounter (INDEPENDENT_AMBULATORY_CARE_PROVIDER_SITE_OTHER): Payer: Self-pay

## 2018-10-10 VITALS — BP 106/60 | HR 108 | Ht 61.0 in | Wt 109.0 lb

## 2018-10-10 DIAGNOSIS — K641 Second degree hemorrhoids: Secondary | ICD-10-CM

## 2018-10-10 DIAGNOSIS — K602 Anal fissure, unspecified: Secondary | ICD-10-CM | POA: Diagnosis not present

## 2018-10-10 MED ORDER — AMBULATORY NON FORMULARY MEDICATION: 1 refills | Status: DC

## 2018-10-10 NOTE — Patient Instructions (Signed)
Anal Fissure, Adult    An anal fissure is a small tear or crack in the tissue around the opening of the butt (anus). Bleeding from the tear or crack usually stops on its own within a few minutes. The bleeding may happen every time you poop (have a bowel movement) until the tear or crack heals.  What are the causes?  This condition is usually caused by passing a large or hard poop (stool). Other causes include:   Trouble pooping (constipation).   Passing watery poop (diarrhea).   Inflammatory bowel disease (Crohn's disease or ulcerative colitis).   Childbirth.   Infections.   Anal sex.  What are the signs or symptoms?  Symptoms of this condition include:   Bleeding from the butt.   Small amounts of blood on your poop. The blood coats the outside of the poop. It is not mixed with the poop.   Small amounts of blood on the toilet paper or in the toilet after you poop.   Pain when passing poop.   Itching or irritation around the opening of the butt.  How is this diagnosed?  This condition may be diagnosed based on a physical exam. Your doctor may:   Check your butt. A tear can often be seen by checking the area with care.   Check your butt using a short tube (anoscope). The light in the tube will show any problems in your butt.  How is this treated?  Treatment for this condition may include:   Treating problems that make it hard for you to pass poop. You may be told to:  ? Eat more fiber.  ? Drink more fluid.  ? Take fiber supplements.  ? Take medicines that make poop soft.   Taking sitz baths. This may help to heal the tear.   Using creams and ointments.  If your condition gets worse, other treatments may be needed such as:   A shot near the tear or crack (botulinum injection).   Surgery to repair the tear or crack.  Follow these instructions at home:  Eating and drinking     Avoid bananas and dairy products. These foods can make it hard to poop.   Drink enough fluid to keep your pee (urine) pale  yellow.   Eat foods that have a lot of fiber in them, such as:  ? Beans.  ? Whole grains.  ? Fresh fruits.  ? Fresh vegetables.  General instructions     Take over-the-counter and prescription medicines only as told by your doctor.   Use creams or ointments only as told by your doctor.   Keep the butt area as clean and dry as you can.   Take a warm water bath (sitz bath) as told by your doctor. Do not use soap.   Keep all follow-up visits as told by your doctor. This is important.  Contact a doctor if:   You have more bleeding.   You have a fever.   You have watery poop that is mixed with blood.   You have pain.   Your problem gets worse, not better.  Summary   An anal fissure is a small tear or crack in the skin around the opening of the butt (anus).   This condition is usually caused by passing a large or hard poop (stool).   Treatment includes treating the problems that make it hard for you to pass poop.   Follow your doctor's instructions about caring for your condition   at home.   Keep all follow-up visits as told by your doctor. This is important.  This information is not intended to replace advice given to you by your health care provider. Make sure you discuss any questions you have with your health care provider.  Document Released: 04/15/2011 Document Revised: 01/27/2018 Document Reviewed: 01/27/2018  Elsevier Interactive Patient Education  2019 [employer]

## 2018-10-10 NOTE — Progress Notes (Signed)
PROCEDURE NOTE: The patient presents with symptomatic grade II  hemorrhoids, requesting rubber band ligation of his/her hemorrhoidal disease.  All risks, benefits and alternative forms of therapy were described and informed consent was obtained.  The anorectum was pre-medicated with 0.125% nitroglycerin and RectiCare In the Left Lateral Decubitus position anorectal and anoscopic examination revealed to superficial anal fissures in right posterior and right anterior position, grade II hemorrhoids in the right anterior, right posterior and left lateral position(s).   The decision was made to not proceed with hemorrhoidal band ligation  The patient was discharged home without pain or other issues.  Dietary and behavioral recommendations were given and along with follow-up instructions.     The following adjunctive treatments were recommended: Benefiber 1 to 2 teaspoon 3 times daily with meals Nitroglycerine 0.125% 3-4 times daily for 6-8 weeks  Recticare small pea-sized amount as needed per rectum up to 3 times daily  The patient will return in 6 to 8 weeks for  follow-up and possible additional banding as required. No complications were encountered and the patient tolerated the procedure well.  Damaris Hippo , MD [phone]

## 2018-10-11 DIAGNOSIS — G43709 Chronic migraine without aura, not intractable, without status migrainosus: Secondary | ICD-10-CM | POA: Diagnosis not present

## 2018-10-26 ENCOUNTER — Telehealth: Payer: Self-pay | Admitting: Gastroenterology

## 2018-10-26 ENCOUNTER — Other Ambulatory Visit: Payer: Self-pay

## 2018-10-26 DIAGNOSIS — K602 Anal fissure, unspecified: Secondary | ICD-10-CM

## 2018-10-26 NOTE — Telephone Encounter (Signed)
Sorry that she is having recurrent anal fissure, failing conservative management with nitroglycerine and fiber supplements. No evidence of Crohn's colitis based on colonoscopy. Please advise patient to continue the current management. Send referral to anoorectal surgery for further management.

## 2018-10-26 NOTE — Telephone Encounter (Signed)
Discussed with the patient. She agrees with this plan. Referral faxed to [facility].

## 2018-10-26 NOTE — Telephone Encounter (Signed)
Had been doing well. Symptoms had become "a little more bearable."  Yesterday she had a sudden increase in rectal pain described as "a ring of fire" and "a lot of blood." She is "doing everything she told me to do to the "T." She thinks she could have a new fissure. Please advise.

## 2018-10-26 NOTE — Telephone Encounter (Signed)
Patient has questions regarding fissure states that she has been doing everything that she was told and states that she might have a new fissure. Has a lot of pain and bleeding.

## 2018-10-28 ENCOUNTER — Telehealth: Payer: Self-pay | Admitting: Gastroenterology

## 2018-10-28 NOTE — Telephone Encounter (Signed)
Pt would like to speak with you regarding her progress, she said that she spoke with you two days ago. Pls call her.

## 2018-10-28 NOTE — Telephone Encounter (Signed)
Patient is dealing with fissures and what she thinks is a prolapses hemorrhoid. She is trying to keep that area dry because of the fissures. Using nitro cream and Recticare. Fiber, water , and keeping her bowel movements soft. She has a surgical referral in place.  Is there anything else she can do for comfort?

## 2018-10-28 NOTE — Telephone Encounter (Signed)
She can try sitz bath twice daily in addition to the above measures. Thank you

## 2018-10-28 NOTE — Telephone Encounter (Signed)
Patient advised of sitz bathes.

## 2018-11-18 ENCOUNTER — Other Ambulatory Visit: Payer: Self-pay

## 2018-11-18 MED ORDER — AMBULATORY NON FORMULARY MEDICATION: 1 refills | Status: DC

## 2018-11-18 NOTE — Telephone Encounter (Signed)
Nitroglycerin ointment refill phoned into [facility] for patient.

## 2018-11-21 ENCOUNTER — Telehealth: Payer: Self-pay | Admitting: Gastroenterology

## 2018-11-21 NOTE — Telephone Encounter (Signed)
Ok, we can schedule telephone visit. Thanks

## 2018-11-21 NOTE — Telephone Encounter (Signed)
She reports she is doing all the things she has been instructed to do. She has a good day for a couple of days then she will have a very painful day. Out of work right now. Able to do more sitz bathes, cold compresses and other comfort measures.  She is scheduled to consult with the surgeon on 11/28/18. She was told this could change as the guidelines for the COVID19 change. She is comfortable talking to you on the phone or coming in to see you. Please advise.

## 2018-11-21 NOTE — Telephone Encounter (Signed)
Patient is in agreement with this.

## 2018-11-21 NOTE — Telephone Encounter (Signed)
Pt requested a call to discuss her appt.  Pt stated that she is having a surgery consult on the 30th and would like advise on whether to keep the upcoming appt with Dr. Silverio Decamp.  Please return her call.

## 2018-11-22 DIAGNOSIS — Z3046 Encounter for surveillance of implantable subdermal contraceptive: Secondary | ICD-10-CM | POA: Diagnosis not present

## 2018-11-22 DIAGNOSIS — Z3202 Encounter for pregnancy test, result negative: Secondary | ICD-10-CM | POA: Diagnosis not present

## 2018-11-23 ENCOUNTER — Other Ambulatory Visit: Payer: Self-pay

## 2018-11-23 ENCOUNTER — Telehealth (INDEPENDENT_AMBULATORY_CARE_PROVIDER_SITE_OTHER): Payer: BLUE CROSS/BLUE SHIELD | Admitting: Gastroenterology

## 2018-11-23 DIAGNOSIS — K602 Anal fissure, unspecified: Secondary | ICD-10-CM

## 2018-11-23 DIAGNOSIS — K642 Third degree hemorrhoids: Secondary | ICD-10-CM

## 2018-11-23 DIAGNOSIS — K6289 Other specified diseases of anus and rectum: Secondary | ICD-10-CM

## 2018-11-23 MED ORDER — AMBULATORY NON FORMULARY MEDICATION: 1 refills | Status: DC

## 2018-11-23 NOTE — Patient Instructions (Addendum)
Increase rectal nitroglycerine 0.250%, apply small pea size amount twice daily X 6-8 weeks  Continue fiber supplements  Sitz bath  Kegel exercises  Follow up with colorectal surgery     Anal Fissure, Adult  An anal fissure is a small tear or crack in the tissue around the opening of the butt (anus). Bleeding from the tear or crack usually stops on its own within a few minutes. The bleeding may happen every time you poop (have a bowel movement) until the tear or crack heals. What are the causes? This condition is usually caused by passing a large or hard poop (stool). Other causes include:  Trouble pooping (constipation).  Passing watery poop (diarrhea).  Inflammatory bowel disease (Crohn's disease or ulcerative colitis).  Childbirth.  Infections.  Anal sex. What are the signs or symptoms? Symptoms of this condition include:  Bleeding from the butt.  Small amounts of blood on your poop. The blood coats the outside of the poop. It is not mixed with the poop.  Small amounts of blood on the toilet paper or in the toilet after you poop.  Pain when passing poop.  Itching or irritation around the opening of the butt. How is this diagnosed? This condition may be diagnosed based on a physical exam. Your doctor may:  Check your butt. A tear can often be seen by checking the area with care.  Check your butt using a short tube (anoscope). The light in the tube will show any problems in your butt. How is this treated? Treatment for this condition may include:  Treating problems that make it hard for you to pass poop. You may be told to: ? Eat more fiber. ? Drink more fluid. ? Take fiber supplements. ? Take medicines that make poop soft.  Taking sitz baths. This may help to heal the tear.  Using creams and ointments. If your condition gets worse, other treatments may be needed such as:  A shot near the tear or crack (botulinum injection).  Surgery to repair the tear  or crack. Follow these instructions at home: Eating and drinking   Avoid bananas and dairy products. These foods can make it hard to poop.  Drink enough fluid to keep your pee (urine) pale yellow.  Eat foods that have a lot of fiber in them, such as: ? Beans. ? Whole grains. ? Fresh fruits. ? Fresh vegetables. General instructions   Take over-the-counter and prescription medicines only as told by your doctor.  Use creams or ointments only as told by your doctor.  Keep the butt area as clean and dry as you can.  Take a warm water bath (sitz bath) as told by your doctor. Do not use soap.  Keep all follow-up visits as told by your doctor. This is important. Contact a doctor if:  You have more bleeding.  You have a fever.  You have watery poop that is mixed with blood.  You have pain.  Your problem gets worse, not better. Summary  An anal fissure is a small tear or crack in the skin around the opening of the butt (anus).  This condition is usually caused by passing a large or hard poop (stool).  Treatment includes treating the problems that make it hard for you to pass poop.  Follow your doctor's instructions about caring for your condition at home.  Keep all follow-up visits as told by your doctor. This is important. This information is not intended to replace advice given to you by  your health care provider. Make sure you discuss any questions you have with your health care provider. Document Released: 04/15/2011 Document Revised: 01/27/2018 Document Reviewed: 01/27/2018 Elsevier Interactive Patient Education  2019 [employer].    How to Take a [employer] A sitz bath is a warm water bath that may be used to care for your rectum, genital area, or the area between your rectum and genitals (perineum). For a sitz bath, the water only comes up to your hips and covers your buttocks. A sitz bath may done at home in a bathtub or with a portable sitz bath that fits over  the toilet. Your health care provider may recommend a sitz bath to help:  Relieve pain and discomfort after delivering a baby.  Relieve pain and itching from hemorrhoids or anal fissures.  Relieve pain after certain surgeries.  Relax muscles that are sore or tight. How to take a sitz bath Take 3-4 sitz baths a day, or as many as told by your health care provider. Bathtub sitz bath To take a sitz bath in a bathtub: 1. Partially fill a bathtub with warm water. The water should be deep enough to cover your hips and buttocks when you are sitting in the tub. 2. If your health care provider told you to put medicine in the water, follow his or her instructions. 3. Sit in the water. 4. Open the tub drain a little, and leave it open during your bath. 5. Turn on the warm water again, enough to replace the water that is draining out. Keep the water running throughout your bath. This helps keep the water at the right level and the right temperature. 6. Soak in the water for 15-20 minutes, or as long as told by your health care provider. 7. When you are done, be careful when you stand up. You may feel dizzy. 8. After the sitz bath, pat yourself dry. Do not rub your skin to dry it.  Over-the-toilet sitz bath To take a sitz bath with an over-the-toilet basin: 1. Follow the manufacturer's instructions. 2. Fill the basin with warm water. 3. If your health care provider told you to put medicine in the water, follow his or her instructions. 4. Sit on the seat. Make sure the water covers your buttocks and perineum. 5. Soak in the water for 15-20 minutes, or as long as told by your health care provider. 6. After the sitz bath, pat yourself dry. Do not rub your skin to dry it. 7. Clean and dry the basin between uses. 8. Discard the basin if it cracks, or according to the manufacturer's instructions. Contact a health care provider if:  Your symptoms get worse. Do not continue with sitz baths if your  symptoms get worse.  You have new symptoms. If this happens, do not continue with sitz baths until you talk with your health care provider. Summary  A sitz bath is a warm water bath in which the water only comes up to your hips and covers your buttocks.  A sitz bath may help relieve itching, relieve pain, and relax muscles that are sore or tight in the lower part of your body, including your genital area.  Take 3-4 sitz baths a day, or as many as told by your health care provider. Soak in the water for 15-20 minutes.  Do not continue with sitz baths if your symptoms get worse. This information is not intended to replace advice given to you by your health care provider. Make  sure you discuss any questions you have with your health care provider. Document Released: 05/09/2004 Document Revised: 08/19/2017 Document Reviewed: 08/19/2017 Elsevier Interactive Patient Education  2019 [employer].

## 2018-11-23 NOTE — Progress Notes (Signed)
Shirley Webb    [id number]    09-06-1980  Primary Care Physician:Prevost, Leonia Reader, FNP  Referring Physician: Holland Commons, Jerome Add[address] [place], [place] [address]  This service was provided via telemedicine due to [place] 19.  Patient location: Home Provider location: Home Used 2 patient identifiers to confirm the correct person. Explained the limitations in evaluation and management via telemedicine. Patient is aware of possible medical charges for this visit.  Patient consented to this virtual visit (via telephone/webex).   Time spent on call: 22 min  Chief complaint: hemorrhoids, anal fissure  HPI: She continues to have persistent symptoms with anal fissure and hemorrhoids.  She was doing better but 2 weeks ago due to change in bowel habits had protrusion of hemorrhoid and since then is having on and off symptoms.  Developed recurrent anal discomfort and burning sensation similar to what she had with anal fissure Slightly getting better in past 3 days, using epsom salt sitz bath with coconut oil along , continuing per rectum nitroglycerine  Fiber 2 scoops 2-3 tablets Fiber tablet 1-2 tablets with meals  Currently one soft bowel movement daily. Has occasional diarrhea and constipation.  Colonoscopy:09/09/18 Normal, biopsies negative for IBD.    Outpatient Encounter Medications as of 11/23/2018  Medication Sig  . AMBULATORY NON FORMULARY MEDICATION Medication Name: Nitroglycerin ointment 0.125% to use pea sized amount per rectum three times a day  . aspirin-acetaminophen-caffeine (EXCEDRIN MIGRAINE) 250-250-65 MG tablet Take 2 tablets by mouth every 6 (six) hours as needed for headache.  . baclofen (LIORESAL) 10 MG tablet Take 10 mg by mouth 3 (three) times daily.  . chlorzoxazone (PARAFON) 500 MG tablet Take 1 tablet by mouth every 8 (eight) hours as needed.  Marland Kitchen dextroamphetamine (DEXTROSTAT) 5 MG tablet Take 5 mg by mouth 2  (two) times daily.  . diphenhydrAMINE (BENADRYL) 25 MG tablet Take 50 mg by mouth every 6 (six) hours as needed for sleep.  Marland Kitchen ELDERBERRY PO Take 5 mLs by mouth daily.  Marland Kitchen EMGALITY 120 MG/ML SOAJ Inject into the skin every 30 (thirty) days.  . Melatonin-Lemon Balm 3-500 MG-MCG TABS Take 1 tablet by mouth at bedtime.  . Multiple Vitamin (MULTIVITAMIN) tablet Take 1 tablet by mouth daily.  . Omega-3 Fatty Acids (FISH OIL) 1000 MG CAPS Take 1 capsule by mouth daily.  . Probiotic Product (PROBIOTIC-10) CAPS Take 1 capsule by mouth daily.  . Rhodiola rosea (RHODIOLA PO) Take 1 vial by mouth.  . rizatriptan (MAXALT) 10 MG tablet Take 1 tablet by mouth daily as needed.  . topiramate (TOPAMAX) 25 MG tablet Take 3 tablets by mouth at bedtime.  . traZODone (DESYREL) 50 MG tablet Take 50-100 mg by mouth at bedtime.    Facility-Administered Encounter Medications as of 11/23/2018  Medication  . 0.9 %  sodium chloride infusion    Allergies as of 11/23/2018 - Review Complete 10/10/2018  Allergen Reaction Noted  . Sulfamethoxazole-trimethoprim Hives and Rash 05/06/2016  . Latex  01/03/2017    Past Medical History:  Diagnosis Date  . Anxiety and depression   . Migraines     Past Surgical History:  Procedure Laterality Date  . APPENDECTOMY    . LAPAROSCOPIC APPENDECTOMY N/A 01/03/2017   Procedure: APPENDECTOMY LAPAROSCOPIC;  Surgeon: Armandina Gemma, MD;  Location: [facility];  Service: General;  Laterality: N/A;    Family History  Problem Relation Age of Onset  . Crohn's disease Paternal  Aunt   . Colon cancer Neg Hx   . Stomach cancer Neg Hx   . Rectal cancer Neg Hx   . Esophageal cancer Neg Hx     Social History   Socioeconomic History  . Marital status: Single    Spouse name: Not on file  . Number of children: Not on file  . Years of education: Not on file  . Highest education level: Not on file  Occupational History  . Occupation: Optician  Social Needs  . Financial resource strain:  Not on file  . Food insecurity:    Worry: Not on file    Inability: Not on file  . Transportation needs:    Medical: Not on file    Non-medical: Not on file  Tobacco Use  . Smoking status: Former Research scientist (life sciences)  . Smokeless tobacco: Never Used  Substance and Sexual Activity  . Alcohol use: Yes    Alcohol/week: 1.0 - 2.0 standard drinks    Types: 1 - 2 Cans of beer per week  . Drug use: Yes    Frequency: 2.0 times per week    Types: Marijuana  . Sexual activity: Not on file  Lifestyle  . Physical activity:    Days per week: Not on file    Minutes per session: Not on file  . Stress: Not on file  Relationships  . Social connections:    Talks on phone: Not on file    Gets together: Not on file    Attends religious service: Not on file    Active member of club or organization: Not on file    Attends meetings of clubs or organizations: Not on file    Relationship status: Not on file  . Intimate partner violence:    Fear of current or ex partner: Not on file    Emotionally abused: Not on file    Physically abused: Not on file    Forced sexual activity: Not on file  Other Topics Concern  . Not on file  Social History Narrative  . Not on file      Review of systems: GI as per HPI  All other systems reviewed and are negative.   Physical Exam: Vitals were not taken and physical exam was not performed during this virtual visit.  Data Reviewed:  Reviewed labs, radiology imaging, old records and pertinent past GI work up   Assessment and Plan/Recommendations:  60 yr F with symptomatic hemorrhoids with tissue protrusion and bleeding. Chronic non healing, recurrent anal fissure likely secondary to manual reduction of protruded hemorrhoids  Follow up with colorectal surgery for management Grade III symptomatic hemorrhoids Continue Sitz bath Advised patient to be gentle and avoid excessive hygiene after a BM  Anal fissure: Nitroglycerine 0.25% per rectum small pea size amount  BID X 6-8 weeks Continue fiber supplements  Kegel exercise to improve anal sphincter tone     K. Denzil Magnuson , MD   CC: Holland Commons, FNP

## 2018-11-28 DIAGNOSIS — K601 Chronic anal fissure: Secondary | ICD-10-CM | POA: Diagnosis not present

## 2019-01-02 ENCOUNTER — Telehealth: Payer: Self-pay | Admitting: Gastroenterology

## 2019-01-02 NOTE — Telephone Encounter (Signed)
Left message of returning her call.

## 2019-01-02 NOTE — Telephone Encounter (Signed)
Patient calling to "touch base" with [place] about her symptoms with hemorrhoids and anal fissures. She is very uncomfortable. Continues to follow the recommendations of Dr Silverio Decamp. She has started eating less hoping she will have less bowel movement because of the discomfort that occurs and lingers afterwards. No new recommendations. She will call [facility] to discuss any possibilities of sooner procedure date. She has had her consultation. Faxing the 11/23/18 encounter to keep the surgeon updated.

## 2019-01-02 NOTE — Telephone Encounter (Signed)
Pt called and wanted to give update on her issue that is going on. She wants to speak with the nurse that her symptoms had gotten worse and want to speak with the nurse about some other recommendations. She was not able to follow through with the surgeon due to the covid-19.

## 2019-01-12 ENCOUNTER — Ambulatory Visit: Payer: Self-pay | Admitting: General Surgery

## 2019-01-18 ENCOUNTER — Other Ambulatory Visit: Payer: Self-pay

## 2019-01-18 ENCOUNTER — Encounter (HOSPITAL_BASED_OUTPATIENT_CLINIC_OR_DEPARTMENT_OTHER): Payer: Self-pay

## 2019-01-18 NOTE — Progress Notes (Signed)
Spoke with: Nevin Bloodgood NPO:  After Midnight, no gum, candy, or mints   Arrival time: 0530AM Labs: UPT (COVID 01/24/2019) AM medications: None Pre op orders: Yes Ride home:  Francee Piccolo (dad) [phone]

## 2019-01-18 NOTE — Progress Notes (Signed)
SPOKE W/  Shirley Webb     SCREENING SYMPTOMS OF COVID 19:   COUGH--NO  RUNNY NOSE--- NO  SORE THROAT---NO  NASAL CONGESTION----NO  SNEEZING----NO  SHORTNESS OF BREATH---NO  DIFFICULTY BREATHING---NO  TEMP >100.0 -----NO  UNEXPLAINED BODY ACHES------NO  CHILLS -------- NO  HEADACHES ---------NO  LOSS OF SMELL/ TASTE --------NO    HAVE YOU OR ANY FAMILY MEMBER TRAVELLED PAST 14 DAYS OUT OF THE   COUNTY---NO STATE----NO COUNTRY----NO  HAVE YOU OR ANY FAMILY MEMBER BEEN EXPOSED TO ANYONE WITH COVID 19? NO

## 2019-01-24 ENCOUNTER — Other Ambulatory Visit (HOSPITAL_COMMUNITY)
Admission: RE | Admit: 2019-01-24 | Discharge: 2019-01-24 | Disposition: A | Discharge status: Home / Self Care | Payer: BLUE CROSS/BLUE SHIELD | Source: Ambulatory Visit | Attending: General Surgery | Admitting: General Surgery

## 2019-01-24 DIAGNOSIS — Z1159 Encounter for screening for other viral diseases: Secondary | ICD-10-CM | POA: Diagnosis not present

## 2019-01-25 LAB — NOVEL CORONAVIRUS, NAA (HOSP ORDER, SEND-OUT TO REF LAB; TAT 18-24 HRS): SARS-CoV-2, NAA: NOT DETECTED

## 2019-01-26 NOTE — Progress Notes (Signed)
SPOKE W/  _Paula     SCREENING SYMPTOMS OF COVID 19:   COUGH--No  RUNNY NOSE---No   SORE THROAT---no  NASAL CONGESTION---no-  SNEEZING--no--  SHORTNESS OF BREATH---no  DIFFICULTY BREATHING---no  TEMP >100.0 ----no-  UNEXPLAINED BODY ACHES----no--  CHILLS -----no---   HEADACHES -------no--  LOSS OF SMELL/ TASTE -------no-    HAVE YOU OR ANY FAMILY MEMBER TRAVELLED PAST 14 DAYS OUT OF THE   COUNTY---no STATE----no COUNTRY----no  HAVE YOU OR ANY FAMILY MEMBER BEEN EXPOSED TO ANYONE WITH COVID 19?

## 2019-01-26 NOTE — Anesthesia Preprocedure Evaluation (Addendum)
Anesthesia Evaluation  Patient identified by MRN, date of birth, ID band Patient awake    Reviewed: Allergy & Precautions, NPO status , Patient's Chart, lab work & pertinent test results  History of Anesthesia Complications (+) PSEUDOCHOLINESTERASE DEFICIENCYNegative for: history of anesthetic complications  Airway Mallampati: II  TM Distance: >3 FB Neck ROM: Full    Dental no notable dental hx. (+) Teeth Intact, Dental Advisory Given   Pulmonary neg pulmonary ROS, former smoker,    Pulmonary exam normal        Cardiovascular negative cardio ROS Normal cardiovascular exam Rhythm:Regular     Neuro/Psych  Headaches, Anxiety Depression negative psych ROS   GI/Hepatic negative GI ROS, Neg liver ROS,   Endo/Other  negative endocrine ROS  Renal/GU negative Renal ROS  negative genitourinary   Musculoskeletal negative musculoskeletal ROS (+)   Abdominal   Peds  Hematology negative hematology ROS (+)   Anesthesia Other Findings   Reproductive/Obstetrics                          Anesthesia Physical Anesthesia Plan  ASA: II  Anesthesia Plan: MAC   Post-op Pain Management:    Induction: Intravenous  PONV Risk Score and Plan: 2 and Propofol infusion and Treatment may vary due to age or medical condition  Airway Management Planned: Natural Airway and Simple Face Mask  Additional Equipment: None  Intra-op Plan:   Post-operative Plan:   Informed Consent: I have reviewed the patients History and Physical, chart, labs and discussed the procedure including the risks, benefits and alternatives for the proposed anesthesia with the patient or authorized representative who has indicated his/her understanding and acceptance.       Plan Discussed with:   Anesthesia Plan Comments:        Anesthesia Quick Evaluation

## 2019-01-27 ENCOUNTER — Encounter (HOSPITAL_BASED_OUTPATIENT_CLINIC_OR_DEPARTMENT_OTHER): Payer: Self-pay

## 2019-01-27 ENCOUNTER — Ambulatory Visit (HOSPITAL_BASED_OUTPATIENT_CLINIC_OR_DEPARTMENT_OTHER): Payer: BLUE CROSS/BLUE SHIELD | Admitting: Anesthesiology

## 2019-01-27 ENCOUNTER — Encounter (HOSPITAL_BASED_OUTPATIENT_CLINIC_OR_DEPARTMENT_OTHER)
Admission: RE | Disposition: A | Discharge status: Home / Self Care | Payer: Self-pay | Source: Home / Self Care | Attending: General Surgery

## 2019-01-27 ENCOUNTER — Ambulatory Visit (HOSPITAL_BASED_OUTPATIENT_CLINIC_OR_DEPARTMENT_OTHER)
Admission: RE | Admit: 2019-01-27 | Discharge: 2019-01-27 | Disposition: A | Discharge status: Home / Self Care | Payer: BLUE CROSS/BLUE SHIELD | Attending: General Surgery | Admitting: General Surgery

## 2019-01-27 DIAGNOSIS — Z91040 Latex allergy status: Secondary | ICD-10-CM | POA: Insufficient documentation

## 2019-01-27 DIAGNOSIS — F329 Major depressive disorder, single episode, unspecified: Secondary | ICD-10-CM | POA: Diagnosis not present

## 2019-01-27 DIAGNOSIS — K648 Other hemorrhoids: Secondary | ICD-10-CM | POA: Diagnosis not present

## 2019-01-27 DIAGNOSIS — Z79899 Other long term (current) drug therapy: Secondary | ICD-10-CM | POA: Insufficient documentation

## 2019-01-27 DIAGNOSIS — Z87891 Personal history of nicotine dependence: Secondary | ICD-10-CM | POA: Insufficient documentation

## 2019-01-27 DIAGNOSIS — K3580 Unspecified acute appendicitis: Secondary | ICD-10-CM | POA: Diagnosis not present

## 2019-01-27 DIAGNOSIS — K6289 Other specified diseases of anus and rectum: Secondary | ICD-10-CM | POA: Diagnosis not present

## 2019-01-27 DIAGNOSIS — K601 Chronic anal fissure: Secondary | ICD-10-CM | POA: Diagnosis not present

## 2019-01-27 DIAGNOSIS — F419 Anxiety disorder, unspecified: Secondary | ICD-10-CM | POA: Insufficient documentation

## 2019-01-27 DIAGNOSIS — Z882 Allergy status to sulfonamides status: Secondary | ICD-10-CM | POA: Insufficient documentation

## 2019-01-27 DIAGNOSIS — R202 Paresthesia of skin: Secondary | ICD-10-CM | POA: Diagnosis not present

## 2019-01-27 DIAGNOSIS — M792 Neuralgia and neuritis, unspecified: Secondary | ICD-10-CM | POA: Diagnosis not present

## 2019-01-27 HISTORY — DX: Low back pain, unspecified: M54.50

## 2019-01-27 HISTORY — DX: Unspecified hemorrhoids: K64.9

## 2019-01-27 HISTORY — PX: SPHINCTEROTOMY: SHX5279

## 2019-01-27 HISTORY — DX: Presence of spectacles and contact lenses: Z97.3

## 2019-01-27 HISTORY — DX: Fracture of unspecified carpal bone, unspecified wrist, initial encounter for closed fracture: S62.109A

## 2019-01-27 HISTORY — DX: Unspecified disturbances of skin sensation: R20.9

## 2019-01-27 HISTORY — DX: Dyspnea, unspecified: R06.00

## 2019-01-27 HISTORY — DX: Personal history of (healed) traumatic fracture: Z87.81

## 2019-01-27 HISTORY — DX: Anal fissure, unspecified: K60.2

## 2019-01-27 HISTORY — DX: Personal history of other diseases of the digestive system: Z87.19

## 2019-01-27 HISTORY — DX: Injury of ulnar nerve at forearm level, unspecified arm, initial encounter: S54.00XA

## 2019-01-27 HISTORY — DX: Weakness: R53.1

## 2019-01-27 HISTORY — DX: Gastro-esophageal reflux disease without esophagitis: K21.9

## 2019-01-27 HISTORY — DX: Personal history of other diseases of the circulatory system: Z86.79

## 2019-01-27 LAB — POCT PREGNANCY, URINE: Preg Test, Ur: NEGATIVE

## 2019-01-27 SURGERY — EXAM UNDER ANESTHESIA
Anesthesia: Monitor Anesthesia Care | Site: Rectum

## 2019-01-27 MED ORDER — ONABOTULINUMTOXINA 100 UNITS IJ SOLR
INTRAMUSCULAR | Status: DC | PRN
  Administered 2019-01-27: 100 [IU] via INTRAMUSCULAR

## 2019-01-27 MED ORDER — ONABOTULINUMTOXINA 100 UNITS IJ SOLR
INTRAMUSCULAR | Status: AC
  Filled 2019-01-27: qty 100

## 2019-01-27 MED ORDER — LACTATED RINGERS IV SOLN
INTRAVENOUS | Status: DC
  Administered 2019-01-27: 07:00:00 via INTRAVENOUS
  Filled 2019-01-27: qty 1000

## 2019-01-27 MED ORDER — OXYCODONE HCL 5 MG/5ML PO SOLN
5.0000 mg | Freq: Once | ORAL | Status: DC | PRN
  Filled 2019-01-27: qty 5

## 2019-01-27 MED ORDER — DEXAMETHASONE SODIUM PHOSPHATE 10 MG/ML IJ SOLN
INTRAMUSCULAR | Status: DC | PRN
  Administered 2019-01-27: 5 mg via INTRAVENOUS

## 2019-01-27 MED ORDER — ONDANSETRON HCL 4 MG/2ML IJ SOLN
4.0000 mg | Freq: Once | INTRAMUSCULAR | Status: DC | PRN
  Filled 2019-01-27: qty 2

## 2019-01-27 MED ORDER — BUPIVACAINE-EPINEPHRINE 0.5% -1:200000 IJ SOLN
INTRAMUSCULAR | Status: DC | PRN
  Administered 2019-01-27: 26 mL

## 2019-01-27 MED ORDER — OXYCODONE HCL 5 MG PO TABS
5.0000 mg | ORAL_TABLET | Freq: Once | ORAL | Status: DC | PRN
  Filled 2019-01-27: qty 1

## 2019-01-27 MED ORDER — SODIUM CHLORIDE (PF) 0.9 % IJ SOLN
INTRAMUSCULAR | Status: DC | PRN
  Administered 2019-01-27: 50 mL

## 2019-01-27 MED ORDER — FENTANYL CITRATE (PF) 100 MCG/2ML IJ SOLN
25.0000 ug | INTRAMUSCULAR | Status: DC | PRN
  Filled 2019-01-27: qty 1

## 2019-01-27 MED ORDER — PROPOFOL 500 MG/50ML IV EMUL
INTRAVENOUS | Status: DC | PRN
  Administered 2019-01-27: 200 ug/kg/min via INTRAVENOUS

## 2019-01-27 MED ORDER — SODIUM CHLORIDE 0.9 % IV SOLN
INTRAVENOUS | Status: DC | PRN
  Administered 2019-01-27: 08:00:00 10 ug/kg/min via INTRAVENOUS

## 2019-01-27 MED ORDER — MIDAZOLAM HCL 2 MG/2ML IJ SOLN
INTRAMUSCULAR | Status: DC | PRN
  Administered 2019-01-27: 2 mg via INTRAVENOUS

## 2019-01-27 MED ORDER — SODIUM CHLORIDE 0.9% FLUSH
3.0000 mL | Freq: Two times a day (BID) | INTRAVENOUS | Status: DC
  Filled 2019-01-27: qty 3

## 2019-01-27 MED ORDER — PROPOFOL 10 MG/ML IV BOLUS
INTRAVENOUS | Status: AC
  Filled 2019-01-27: qty 40

## 2019-01-27 MED ORDER — TRAMADOL HCL 50 MG PO TABS: 50.0000 mg | ORAL_TABLET | Freq: Four times a day (QID) | ORAL | 0 refills | Status: DC | PRN

## 2019-01-27 MED ORDER — ONDANSETRON HCL 4 MG/2ML IJ SOLN
INTRAMUSCULAR | Status: DC | PRN
  Administered 2019-01-27: 4 mg via INTRAVENOUS

## 2019-01-27 SURGICAL SUPPLY — 63 items
APL SKNCLS STERI-STRIP NONHPOA (GAUZE/BANDAGES/DRESSINGS) ×1
BENZOIN TINCTURE PRP APPL 2/3 (GAUZE/BANDAGES/DRESSINGS) ×2 IMPLANT
BLADE EXTENDED COATED 6.5IN (ELECTRODE) IMPLANT
BLADE HEX COATED 2.75 (ELECTRODE) ×1 IMPLANT
BLADE SURG 10 STRL SS (BLADE) IMPLANT
BLADE SURG 15 STRL LF DISP TIS (BLADE) ×1 IMPLANT
BLADE SURG 15 STRL SS (BLADE)
BRIEF STRETCH FOR OB PAD LRG (UNDERPADS AND DIAPERS) ×2 IMPLANT
CANISTER SUCT 3000ML PPV (MISCELLANEOUS) ×1 IMPLANT
COVER BACK TABLE 60X90IN (DRAPES) ×2 IMPLANT
COVER MAYO STAND STRL (DRAPES) ×2 IMPLANT
COVER WAND RF STERILE (DRAPES) ×4 IMPLANT
DRAPE HYSTEROSCOPY (DRAPE) IMPLANT
DRAPE LAPAROTOMY 100X72 PEDS (DRAPES) ×2 IMPLANT
DRAPE SHEET LG 3/4 BI-LAMINATE (DRAPES) IMPLANT
DRAPE UTILITY XL STRL (DRAPES) ×2 IMPLANT
ELECT REM PT RETURN 9FT ADLT (ELECTROSURGICAL) ×2
ELECTRODE REM PT RTRN 9FT ADLT (ELECTROSURGICAL) ×1 IMPLANT
GAUZE SPONGE 4X4 12PLY STRL (GAUZE/BANDAGES/DRESSINGS) ×1 IMPLANT
GLOVE BIO SURGEON STRL SZ 6.5 (GLOVE) ×1 IMPLANT
GLOVE BIOGEL PI IND STRL 6.5 (GLOVE) IMPLANT
GLOVE BIOGEL PI IND STRL 7.0 (GLOVE) ×1 IMPLANT
GLOVE BIOGEL PI IND STRL 7.5 (GLOVE) IMPLANT
GLOVE BIOGEL PI INDICATOR 6.5 (GLOVE) ×1
GLOVE BIOGEL PI INDICATOR 7.0 (GLOVE)
GLOVE BIOGEL PI INDICATOR 7.5 (GLOVE) ×2
GLOVE INDICATOR 7.0 STRL GRN (GLOVE) ×1 IMPLANT
GLOVE INDICATOR 7.5 STRL GRN (GLOVE) ×1 IMPLANT
GOWN SPEC L3 XXLG W/TWL (GOWN DISPOSABLE) ×2 IMPLANT
GOWN STRL REUS W/TWL 2XL LVL3 (GOWN DISPOSABLE) ×2 IMPLANT
HYDROGEN PEROXIDE 16OZ (MISCELLANEOUS) IMPLANT
IV CATH 14GX2 1/4 (CATHETERS) IMPLANT
IV CATH 18G SAFETY (IV SOLUTION) IMPLANT
KIT TURNOVER CYSTO (KITS) ×2 IMPLANT
LEGGING LITHOTOMY PAIR STRL (DRAPES) IMPLANT
LOOP VESSEL MAXI BLUE (MISCELLANEOUS) IMPLANT
NDL SAFETY ECLIPSE 18X1.5 (NEEDLE) IMPLANT
NEEDLE HYPO 18GX1.5 SHARP (NEEDLE)
NEEDLE HYPO 22GX1.5 SAFETY (NEEDLE) ×2 IMPLANT
NS IRRIG 500ML POUR BTL (IV SOLUTION) ×2 IMPLANT
PACK BASIN DAY SURGERY FS (CUSTOM PROCEDURE TRAY) ×2 IMPLANT
PAD ABD 8X10 STRL (GAUZE/BANDAGES/DRESSINGS) ×2 IMPLANT
PAD ARMBOARD 7.5X6 YLW CONV (MISCELLANEOUS) ×2 IMPLANT
PAD PREP 24X48 CUFFED NSTRL (MISCELLANEOUS) IMPLANT
PENCIL BUTTON HOLSTER BLD 10FT (ELECTRODE) ×2 IMPLANT
SPONGE HEMORRHOID 8X3CM (HEMOSTASIS) IMPLANT
SPONGE SURGIFOAM ABS GEL 100 (HEMOSTASIS) IMPLANT
SPONGE SURGIFOAM ABS GEL 12-7 (HEMOSTASIS) IMPLANT
SUT CHROMIC 2 0 SH (SUTURE) IMPLANT
SUT CHROMIC 3 0 SH 27 (SUTURE) IMPLANT
SUT ETHIBOND 0 (SUTURE) IMPLANT
SUT VIC AB 2-0 SH 27 (SUTURE)
SUT VIC AB 2-0 SH 27XBRD (SUTURE) IMPLANT
SUT VIC AB 3-0 SH 27 (SUTURE)
SUT VIC AB 3-0 SH 27XBRD (SUTURE) IMPLANT
SYR 10ML LL (SYRINGE) IMPLANT
SYR CONTROL 10ML LL (SYRINGE) ×2 IMPLANT
TOWEL OR 17X26 10 PK STRL BLUE (TOWEL DISPOSABLE) ×2 IMPLANT
TRAY DSU PREP LF (CUSTOM PROCEDURE TRAY) ×2 IMPLANT
TUBE CONNECTING 12X1/4 (SUCTIONS) ×2 IMPLANT
UNDERPAD 30X30 (UNDERPADS AND DIAPERS) ×2 IMPLANT
WATER STERILE IRR 500ML POUR (IV SOLUTION) ×2 IMPLANT
YANKAUER SUCT BULB TIP NO VENT (SUCTIONS) ×2 IMPLANT

## 2019-01-27 NOTE — Anesthesia Postprocedure Evaluation (Signed)
Anesthesia Post Note  Patient: Shirley Webb  Procedure(s) Performed: ANAL EXAM UNDER ANESTHESIA (N/A Rectum) CHEMICAL SPHINCTEROTOMY BOTOX (N/A Rectum)     Patient location during evaluation: PACU Anesthesia Type: MAC Level of consciousness: awake and alert Pain management: pain level controlled Vital Signs Assessment: post-procedure vital signs reviewed and stable Respiratory status: spontaneous breathing, nonlabored ventilation and respiratory function stable Cardiovascular status: blood pressure returned to baseline and stable Postop Assessment: no apparent nausea or vomiting Anesthetic complications: no    Last Vitals:  Vitals:   01/27/19 0835 01/27/19 0915  BP:  (!) 101/56  Pulse: 74 66  Resp: 10 18  Temp: 36.9 C 36.7 C  SpO2: 100% 100%    Last Pain:  Vitals:   01/27/19 0915  TempSrc:   PainSc: 0-No pain                 Lidia Collum

## 2019-01-27 NOTE — Op Note (Signed)
01/27/2019  7:49 AM  PATIENT:  Shirley Webb  38 y.o. female  Patient Care Team: Holland Commons, FNP as PCP - General (Internal Medicine)  PRE-OPERATIVE DIAGNOSIS:  CHRONIC ANAL FISSURE  POST-OPERATIVE DIAGNOSIS:  CHRONIC ANAL FISSURE  PROCEDURE:  Procedure(s): ANAL EXAM UNDER ANESTHESIA CHEMICAL SPHINCTEROTOMY   SURGEON:  Surgeon(s): Leighton Ruff, MD  ASSISTANT: none   ANESTHESIA:   local and MAC  SPECIMEN:  No Specimen  DISPOSITION OF SPECIMEN:  N/A  COUNTS:  YES  PLAN OF CARE: Discharge to home after PACU  PATIENT DISPOSITION:  PACU - hemodynamically stable.  INDICATION: 38 y.o. F with chronic anal fissure   OR FINDINGS: chronic posterior midline anal fissure  DESCRIPTION: the patient was identified in the preoperative holding area and taken to the OR where they were laid on the operating room table.  MAC anesthesia was induced without difficulty. The patient was then positioned in prone jackknife position with buttocks gently taped apart.  The patient was then prepped and draped in usual sterile fashion.  SCDs were noted to be in place prior to the initiation of anesthesia. A surgical timeout was performed indicating the correct patient, procedure, positioning and need for preoperative antibiotics.  A rectal block was performed using Marcaine with epinephrine.    I began with a digital rectal exam.  There was obvious sphincter hypertension.  I then placed a Hill-Ferguson anoscope into the anal canal and evaluated this completely.  There was a chronic appearing posterior midline anal fissure.  There was minimal internal hemorrhoid disease noted.  I placed 100 units of Botox into the intersphincteric groove circumferentially.  Patient tolerated this well.  They were then awakened from anesthesia and sent to the postanesthesia care unit in stable condition.  All counts were correct per operating room staff.

## 2019-01-27 NOTE — Discharge Instructions (Addendum)
Post Anesthesia Home Care Instructions  Activity: Get plenty of rest for the remainder of the day. A responsible adult should stay with you for 24 hours following the procedure.  For the next 24 hours, DO NOT: -Drive a car -Paediatric nurse -Drink alcoholic beverages -Take any medication unless instructed by your physician -Make any legal decisions or sign important papers.  Meals: Start with liquid foods such as gelatin or soup. Progress to regular foods as tolerated. Avoid greasy, spicy, heavy foods. If nausea and/or vomiting occur, drink only clear liquids until the nausea and/or vomiting subsides. Call your physician if vomiting continues.  Special Instructions/Symptoms: Your throat may feel dry or sore from the anesthesia or the breathing tube placed in your throat during surgery. If this causes discomfort, gargle with warm salt water. The discomfort should disappear within 24 hours.  If you had a scopolamine patch placed behind your ear for the management of post- operative nausea and/or vomiting:  1. The medication in the patch is effective for 72 hours, after which it should be removed.  Wrap patch in a tissue and discard in the trash. Wash hands thoroughly with soap and water. 2. You may remove the patch earlier than 72 hours if you experience unpleasant side effects which may include dry mouth, dizziness or visual disturbances. 3. Avoid touching the patch. Wash your hands with soap and water after contact with the patch.   ANORECTAL SURGERY: POST OP INSTRUCTIONS 1. Take your usually prescribed home medications unless otherwise directed. 2. DIET: During the first few hours after surgery sip on some liquids until you are able to urinate.  It is normal to not urinate for several hours after this surgery.  If you feel uncomfortable, please contact the office for instructions.  After you are able to urinate,you may eat, if you feel like it.  Follow a light bland diet the first 24  hours after arrival home, such as soup, liquids, crackers, etc.  Be sure to include lots of fluids daily (6-8 glasses).  Avoid fast food or heavy meals, as your are more likely to get nauseated.  Eat a low fat diet the next few days after surgery.  Limit caffeine intake to 1-2 servings a day. 3. PAIN CONTROL: a. Pain is best controlled by a usual combination of several different methods TOGETHER: i. Muscle relaxation 1.  Soak in a warm bath (or Sitz bath) three times a day and after bowel movements.  Continue to do this until all pain is resolved.  ii. Over the counter pain medication iii. Prescription pain medication b. Most patients will experience some swelling and discomfort in the anus/rectal area and incisions.  Heat such as warm towels, sitz baths, warm baths, etc to help relax tight/sore spots and speed recovery.  Some people prefer to use ice, especially in the first couple days after surgery, as it may decrease the pain and swelling, or alternate between ice & heat.  Experiment to what works for you.  Swelling and bruising can take several weeks to resolve.  Pain can take even longer to completely resolve. c. It is helpful to take an over-the-counter pain medication regularly for the first few weeks.  Choose one of the following that works best for you: i. Naproxen (Aleve, etc)  Two 220mg  tabs twice a day ii. Ibuprofen (Advil, etc) Three 200mg  tabs four times a day (every meal & bedtime) d. A  prescription for pain medication (such as percocet, oxycodone, hydrocodone, etc) should be given  to you upon discharge.  Take your pain medication as prescribed.  i. If you are having problems/concerns with the prescription medicine (does not control pain, nausea, vomiting, rash, itching, etc), please call us [phone] to see if we need to switch you to a different pain medicine that will work better for you and/or control your side effect better. ii. If you need a refill on your pain medication,  please contact your pharmacy.  They will contact our office to request authorization. Prescriptions will not be filled after 5 pm or on week-ends. 4. KEEP YOUR BOWELS REGULAR and AVOID CONSTIPATION a. The goal is one to two soft bowel movements a day.  You should at least have a bowel movement every other day. b. Avoid getting constipated.  Between the surgery and the pain medications, it is common to experience some constipation. This can be very painful after rectal surgery.  Increasing fluid intake and taking a fiber supplement (such as Metamucil, Citrucel, FiberCon, etc) 1-2 times a day regularly will usually help prevent this problem from occurring.  A stool softener like colace is also recommended.  This can be purchased over the counter at your pharmacy.  You can take it up to 3 times a day.  If you do not have a bowel movement after 24 hrs since your surgery, take one does of milk of magnesia.  If you still haven't had a bowel movement 8-12 hours after that dose, take another dose.  If you don't have a bowel movement 48 hrs after surgery, purchase a Fleets enema from the drug store and administer gently per package instructions.  If you still are having trouble with your bowel movements after that, please call the office for further instructions. c. If you develop diarrhea or have many loose bowel movements, simplify your diet to bland foods & liquids for a few days.  Stop any stool softeners and decrease your fiber supplement.  Switching to mild anti-diarrheal medications (Kayopectate, Pepto Bismol) can help.  If this worsens or does not improve, please call us.  5. Wound Care a. Remove your bandages before your first bowel movement or 8 hours after surgery.     b. Remove any wound packing material at this tim,e as well.  You do not need to repack the wound unless instructed otherwise.  Wear an absorbent pad or soft cotton gauze in your underwear to catch any drainage and help keep the area clean.  You should change this every 2-3 hours while awake. c. Keep the area clean and dry.  Bathe / shower every day, especially after bowel movements.  Keep the area clean by showering / bathing over the incision / wound.   It is okay to soak an open wound to help wash it.  Wet wipes or showers / gentle washing after bowel movements is often less traumatic than regular toilet paper. d. Dennis Bast may have some styrofoam-like soft packing in the rectum which will come out with the first bowel movement.  e. You will often notice bleeding with bowel movements.  This should slow down by the end of the first week of surgery f. Expect some drainage.  This should slow down, too, by the end of the first week of surgery.  Wear an absorbent pad or soft cotton gauze in your underwear until the drainage stops. g. Do Not sit on a rubber or pillow ring.  This can make you symptoms worse.  You may sit on a soft pillow if  needed.  6. ACTIVITIES as tolerated:   a. You may resume regular (light) daily activities beginning the next day--such as daily self-care, walking, climbing stairs--gradually increasing activities as tolerated.  If you can walk 30 minutes without difficulty, it is safe to try more intense activity such as jogging, treadmill, bicycling, low-impact aerobics, swimming, etc. b. Save the most intensive and strenuous activity for last such as sit-ups, heavy lifting, contact sports, etc  Refrain from any heavy lifting or straining until you are off narcotics for pain control.   c. You may drive when you are no longer taking prescription pain medication, you can comfortably sit for long periods of time, and you can safely maneuver your car and apply brakes. d. Dennis Bast may have sexual intercourse when it is comfortable.  7. FOLLOW UP in our office a. Please call [facility] at [phone] to set up an appointment to see your surgeon in the office for a follow-up appointment approximately 3-4 weeks after your surgery. b. Make sure  that you call for this appointment the day you arrive home to insure a convenient appointment time. 10. IF YOU HAVE DISABILITY OR FAMILY LEAVE FORMS, BRING THEM TO THE OFFICE FOR PROCESSING.  DO NOT GIVE THEM TO YOUR DOCTOR.     WHEN TO CALL us [phone]: 1. Poor pain control 2. Reactions / problems with new medications (rash/itching, nausea, etc)  3. Fever over 101.5 F (38.5 C) 4. Inability to urinate 5. Nausea and/or vomiting 6. Worsening swelling or bruising 7. Continued bleeding from incision. 8. Increased pain, redness, or drainage from the incision  The clinic staff is available to answer your questions during regular business hours (8:30am-5pm).  Please dont hesitate to call and ask to speak to one of our nurses for clinical concerns.   A surgeon from [facility] is always on call at the hospitals   If you have a medical emergency, go to the nearest emergency room or call 911.    [facility], [address], [address], [place], [place]  [address] ? MAIN: [phone] ? TOLL FREE: [phone] ? FAX [phone] [email or web address]

## 2019-01-27 NOTE — Anesthesia Procedure Notes (Signed)
Procedure Name: MAC Date/Time: 01/27/2019 7:30 AM Performed by: Wanita Chamberlain, CRNA Pre-anesthesia Checklist: Patient identified, Emergency Drugs available, Suction available and Patient being monitored Patient Re-evaluated:Patient Re-evaluated prior to induction Oxygen Delivery Method: Nasal cannula Preoxygenation: Pre-oxygenation with 100% oxygen Placement Confirmation: breath sounds checked- equal and bilateral,  CO2 detector and positive ETCO2

## 2019-01-27 NOTE — H&P (Signed)
Shirley Webb is an 38 y.o. female.   Chief Complaint: anal fissure HPI: 38 y.o. F with chronic anal fissure who has failed medical management  Past Medical History:  Diagnosis Date  . Anal fissure   . Anxiety and depression   . Cold hands and feet   . Dyspnea   . Generalized weakness   . GERD (gastroesophageal reflux disease)    occ  . Hemorrhoids   . History of appendicitis   . History of fracture of right ankle   . History of vasculitis    Childhood  . Low back pain   . Migraines   . 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 Wears contact lenses   . Wears glasses   . Wrist fracture    Left    Past Surgical History:  Procedure Laterality Date  . COLONOSCOPY W/ POLYPECTOMY  08/2018  . FRACTURE SURGERY     Ankle fracture  . hardware removal Right    Ankle  . LAPAROSCOPIC APPENDECTOMY N/A 01/03/2017   Procedure: APPENDECTOMY LAPAROSCOPIC;  Surgeon: Armandina Gemma, MD;  Location: [facility];  Service: General;  Laterality: N/A;    Family History  Problem Relation Age of Onset  . Crohn's disease Paternal Aunt   . Colon cancer Neg Hx   . Stomach cancer Neg Hx   . Rectal cancer Neg Hx   . Esophageal cancer Neg Hx    Social History:  reports that she has quit smoking. She has never used smokeless tobacco. She reports current alcohol use of about 1.0 - 2.0 standard drinks of alcohol per week. She reports current drug use. Frequency: 2.00 times per week. Drug: Marijuana.  Allergies:  Allergies  Allergen Reactions  . Sulfamethoxazole-Trimethoprim Hives and Rash  . Latex     Latex sensitivity--usually okay with gloves and such, but once had a reaction characteristic of a latex allergy    Facility-Administered Medications Prior to Admission  Medication Dose Route Frequency Provider Last Rate Last Dose  . 0.9 %  sodium chloride infusion  500 mL Intravenous Once Nandigam, Venia Minks, MD       Medications Prior to Admission  Medication Sig Dispense Refill  . AMBULATORY NON  FORMULARY MEDICATION Medication Name: Nitroglycerin ointment 0.125% to use pea sized amount per rectum three times a day 30 g 1  . AMBULATORY NON FORMULARY MEDICATION Medication Name: Nitroglycerin Ointment 0.250% use pea sized amount twice daily for 6-8 weeks 30 g 1  . chlorzoxazone (PARAFON) 500 MG tablet Take 1 tablet by mouth every 8 (eight) hours as needed.  3  . dextroamphetamine (DEXTROSTAT) 5 MG tablet Take 5 mg by mouth 2 (two) times daily.    Marland Kitchen diltiazem 2 % GEL Apply 1 application topically 4 (four) times daily.    Marland Kitchen ELDERBERRY PO Take 5 mLs by mouth daily.    . Etonogestrel (NEXPLANON [place]) Inject into the skin.    . Melatonin-Lemon Balm 3-500 MG-MCG TABS Take 1 tablet by mouth at bedtime.    . Omega-3 Fatty Acids (FISH OIL) 1000 MG CAPS Take 1 capsule by mouth daily.    . polyethylene glycol (MIRALAX) 17 g packet Take 17 g by mouth daily.    . Probiotic Product (PROBIOTIC-10) CAPS Take 1 capsule by mouth as needed.     . Rhodiola rosea (RHODIOLA PO) Take 1 vial by mouth.    . Topiramate ER (TROKENDI XR) 100 MG CP24 Take by mouth every evening.    Marland Kitchen  traZODone (DESYREL) 50 MG tablet Take 50-100 mg by mouth at bedtime.     . baclofen (LIORESAL) 10 MG tablet Take 10 mg by mouth as needed.     . diphenhydrAMINE (BENADRYL) 25 MG tablet Take 50 mg by mouth every 6 (six) hours as needed for sleep.    Marland Kitchen EMGALITY 120 MG/ML SOAJ Inject into the skin every 30 (thirty) days.  5  . Multiple Vitamin (MULTIVITAMIN) tablet Take 1 tablet by mouth daily.    . rizatriptan (MAXALT) 10 MG tablet Take 1 tablet by mouth daily as needed.  2    Results for orders placed or performed during the hospital encounter of 01/27/19 (from the past 48 hour(s))  Pregnancy, urine POC     Status: None   Collection Time: 01/27/19  6:41 AM  Result Value Ref Range   Preg Test, Ur NEGATIVE NEGATIVE    Comment:        THE SENSITIVITY OF THIS METHODOLOGY IS >24 mIU/mL    No results found.  Review of Systems   Constitutional: Negative for chills and fever.  HENT: Negative for hearing loss.   Eyes: Negative for blurred vision.  Cardiovascular: Negative for chest pain and palpitations.  Gastrointestinal: Negative for nausea and vomiting.  Genitourinary: Negative for dysuria and urgency.  Musculoskeletal: Negative for myalgias.  Skin: Negative for itching and rash.  Neurological: Negative for dizziness.    Blood pressure (!) 102/59, pulse 84, temperature 98.2 F (36.8 C), temperature source Oral, resp. rate 14, height 5\' 1"  (1.549 m), weight 48.5 kg, SpO2 100 %. Physical Exam  Constitutional: She is oriented to person, place, and time. She appears well-developed and well-nourished.  HENT:  Head: Normocephalic and atraumatic.  Eyes: Pupils are equal, round, and reactive to light. Conjunctivae and EOM are normal.  Neck: Normal range of motion. Neck supple.  Cardiovascular: Normal rate and regular rhythm.  Respiratory: Effort normal. No respiratory distress.  GI: Soft. She exhibits no distension. There is no abdominal tenderness.  Musculoskeletal: Normal range of motion.  Neurological: She is alert and oriented to person, place, and time.  Skin: Skin is warm and dry.     Assessment/Plan 38 y.o. F with chronic anal fissure despite treatment with diltiazem ointment.  I have recommended chemical sphincterotomy.  Risks include bleeding, thrombosed hemorrhoids, recurrence and pain.  I believe she understands this and wishes to proceed.  Rosario Adie, MD 3/47/4259, 7:10 AM

## 2019-01-27 NOTE — Transfer of Care (Signed)
Immediate Anesthesia Transfer of Care Note  Patient: Shirley Webb  Procedure(s) Performed: ANAL EXAM UNDER ANESTHESIA (N/A Rectum) CHEMICAL SPHINCTEROTOMY BOTOX (N/A Rectum)  Patient Location: PACU  Anesthesia Type:MAC  Level of Consciousness: awake, alert , oriented and patient cooperative  Airway & Oxygen Therapy: Patient Spontanous Breathing and Patient connected to nasal cannula oxygen  Post-op Assessment: Report given to RN and Post -op Vital signs reviewed and stable  Post vital signs: Reviewed and stable  Last Vitals:  Vitals Value Taken Time  BP    Temp    Pulse 84 01/27/2019  7:55 AM  Resp    SpO2 100 % 01/27/2019  7:55 AM  Vitals shown include unvalidated device data.  Last Pain:  Vitals:   01/27/19 0603  TempSrc: Oral  PainSc: 0-No pain      Patients Stated Pain Goal: 7 (79/48/01 6553)  Complications: No apparent anesthesia complications

## 2019-01-30 ENCOUNTER — Encounter (HOSPITAL_BASED_OUTPATIENT_CLINIC_OR_DEPARTMENT_OTHER): Payer: Self-pay | Admitting: General Surgery

## 2019-02-21 ENCOUNTER — Telehealth: Payer: Self-pay | Admitting: Gastroenterology

## 2019-02-21 NOTE — Telephone Encounter (Signed)
Pt requested a call back regarding symptoms and after care for botox surgery for fissure.

## 2019-02-21 NOTE — Telephone Encounter (Signed)
Pt called and states she had fissure/botox procedure with Dr. Marcello Moores at [facility]. Pt reports she has had issues with after care and has had rectal spasms and a lot of pain. Pt has not been very pleased with care, feels as if each time she sees the physician it is like she has just seen her and her records have not been reviewed. Pt has multiple questions regarding diet/fiber/other options for fissure if does not improve, pt even made the comment she was rambling. She states she has been keeping a diary of all symptoms. Discussed with her that she should make an appt to come in and be seen, she agreed. Pt scheduled for inperson visit with Dr. Silverio Decamp for 03/01/19@4pm , pt aware of appt.

## 2019-02-23 DIAGNOSIS — G43709 Chronic migraine without aura, not intractable, without status migrainosus: Secondary | ICD-10-CM | POA: Diagnosis not present

## 2019-02-23 DIAGNOSIS — F419 Anxiety disorder, unspecified: Secondary | ICD-10-CM | POA: Diagnosis not present

## 2019-02-28 ENCOUNTER — Telehealth: Payer: Self-pay | Admitting: General Surgery

## 2019-02-28 NOTE — Telephone Encounter (Signed)
Covid-19 screening questions   Do you now or have you had a fever in the last 14 days? NO   Do you have any respiratory symptoms of shortness of breath or cough now or in the last 14 days? NO  Do you have any family members or close contacts with diagnosed or suspected Covid-19 in the past 14 days? NO  Have you been tested for Covid-19 and found to be positive? NO        

## 2019-03-01 ENCOUNTER — Ambulatory Visit: Payer: BC Managed Care – PPO | Admitting: Gastroenterology

## 2019-03-01 ENCOUNTER — Other Ambulatory Visit: Payer: Self-pay

## 2019-03-01 VITALS — BP 128/72 | HR 93 | Temp 98.2°F | Ht 61.0 in | Wt 102.0 lb

## 2019-03-01 DIAGNOSIS — M62838 Other muscle spasm: Secondary | ICD-10-CM

## 2019-03-01 DIAGNOSIS — K602 Anal fissure, unspecified: Secondary | ICD-10-CM

## 2019-03-01 DIAGNOSIS — K581 Irritable bowel syndrome with constipation: Secondary | ICD-10-CM

## 2019-03-01 MED ORDER — HYOSCYAMINE SULFATE SL 0.125 MG SL SUBL: 1.0000 | SUBLINGUAL_TABLET | Freq: Two times a day (BID) | SUBLINGUAL | 1 refills | Status: DC

## 2019-03-01 NOTE — Progress Notes (Signed)
Shirley Webb    [id number]    03/27/1981  Primary Care Physician:Prevost, Leonia Reader, FNP  Referring Physician: Holland Commons, [address] [place],  [place] [address]   Chief complaint: Anorectal discomfort, spasms HPI:  38 year old female with chronic nonhealing anal fissure here for follow-up visit. Underwent chemical sphincterotomy with Botox injection 01/27/2019 by Dr. Marcello Moores.  Patient states she was doing well for few days after the procedure but started having intense spasms going down her legs, buttocks, vagina and also in the rectum. She is very anxious, tearful.  She has changed her diet significantly " following anti-inflammatory diet that she looked up in the Internet", started taking multiple over-the-counter herbal supplements remedies She is taking MiraLAX and Benefiber, avoiding hard stool or excessive straining.  She had an episode of small-volume bright red blood per rectum last week, none since then.  Continues to have pain during defecation. She was using nitroglycerin per rectum without any improvement prior to the procedure, has not used any rectal cream or suppositories since then.  Outpatient Encounter Medications as of 03/01/2019  Medication Sig  . baclofen (LIORESAL) 10 MG tablet Take 10 mg by mouth as needed.   . Bromelain 100 MG TABS Take 1 tablet by mouth 3 (three) times daily.  . chlorzoxazone (PARAFON) 500 MG tablet Take 1 tablet by mouth every 8 (eight) hours as needed.  . diphenhydrAMINE (BENADRYL) 25 MG tablet Take 50 mg by mouth every 6 (six) hours as needed for sleep.  Marland Kitchen ELDERBERRY PO Take 5 mLs by mouth daily.  Marland Kitchen EMGALITY 120 MG/ML SOAJ Inject into the skin every 30 (thirty) days.  . Etonogestrel (NEXPLANON [place]) Inject into the skin.  . Melatonin-Lemon Balm 3-500 MG-MCG TABS Take 1 tablet by mouth at bedtime.  . Multiple Vitamin (MULTIVITAMIN) tablet Take 1 tablet by mouth daily.  . Omega-3 Fatty Acids (FISH  OIL) 1000 MG CAPS Take 1 capsule by mouth daily.  . polyethylene glycol (MIRALAX) 17 g packet Take 17 g by mouth daily.  . Probiotic Product (PROBIOTIC-10) CAPS Take 1 capsule by mouth as needed.   . Rhodiola rosea (RHODIOLA PO) Take 1 vial by mouth.  . rizatriptan (MAXALT) 10 MG tablet Take 1 tablet by mouth daily as needed.  . Topiramate ER (TROKENDI XR) 100 MG CP24 Take by mouth every evening.  . traZODone (DESYREL) 50 MG tablet Take 50-100 mg by mouth at bedtime.   . Turmeric (QC TUMERIC COMPLEX) 500 MG CAPS Take 2 capsules by mouth daily.  . Wheat Dextrin (BENEFIBER) POWD Take by mouth. 2 teaspoons tid  . [DISCONTINUED] diltiazem 2 % GEL Apply 1 application topically 4 (four) times daily.  . [DISCONTINUED] traMADol (ULTRAM) 50 MG tablet Take 1-2 tablets (50-100 mg total) by mouth every 6 (six) hours as needed.  Marland Kitchen dextroamphetamine (DEXTROSTAT) 5 MG tablet Take 5 mg by mouth 2 (two) times daily.  . [DISCONTINUED] AMBULATORY NON FORMULARY MEDICATION Medication Name: Nitroglycerin ointment 0.125% to use pea sized amount per rectum three times a day  . [DISCONTINUED] AMBULATORY NON FORMULARY MEDICATION Medication Name: Nitroglycerin Ointment 0.250% use pea sized amount twice daily for 6-8 weeks   Facility-Administered Encounter Medications as of 03/01/2019  Medication  . 0.9 %  sodium chloride infusion    Allergies as of 03/01/2019 - Review Complete 03/01/2019  Allergen Reaction Noted  . Sulfamethoxazole-trimethoprim Hives and Rash 05/06/2016  . Latex  01/03/2017  Past Medical History:  Diagnosis Date  . Anal fissure   . Anxiety and depression   . Cold hands and feet   . Dyspnea   . Generalized weakness   . GERD (gastroesophageal reflux disease)    occ  . Hemorrhoids   . History of appendicitis   . History of fracture of right ankle   . History of vasculitis    Childhood  . Low back pain   . Migraines   . 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 Wears contact lenses    . Wears glasses   . Wrist fracture    Left    Past Surgical History:  Procedure Laterality Date  . COLONOSCOPY W/ POLYPECTOMY  08/2018  . FRACTURE SURGERY     Ankle fracture  . hardware removal Right    Ankle  . LAPAROSCOPIC APPENDECTOMY N/A 01/03/2017   Procedure: APPENDECTOMY LAPAROSCOPIC;  Surgeon: Armandina Gemma, MD;  Location: [facility];  Service: General;  Laterality: N/A;  . SPHINCTEROTOMY N/A 01/27/2019   Procedure: CHEMICAL SPHINCTEROTOMY BOTOX;  Surgeon: Leighton Ruff, MD;  Location: [facility];  Service: General;  Laterality: N/A;    Family History  Problem Relation Age of Onset  . Crohn's disease Paternal Aunt   . Colon cancer Neg Hx   . Stomach cancer Neg Hx   . Rectal cancer Neg Hx   . Esophageal cancer Neg Hx     Social History   Socioeconomic History  . Marital status: Single    Spouse name: Not on file  . Number of children: Not on file  . Years of education: Not on file  . Highest education level: Not on file  Occupational History  . Occupation: Optician  Social Needs  . Financial resource strain: Not on file  . Food insecurity    Worry: Not on file    Inability: Not on file  . Transportation needs    Medical: Not on file    Non-medical: Not on file  Tobacco Use  . Smoking status: Former Research scientist (life sciences)  . Smokeless tobacco: Never Used  Substance and Sexual Activity  . Alcohol use: Yes    Alcohol/week: 1.0 - 2.0 standard drinks    Types: 1 - 2 Cans of beer per week  . Drug use: Yes    Frequency: 2.0 times per week    Types: Marijuana  . Sexual activity: Not on file  Lifestyle  . Physical activity    Days per week: Not on file    Minutes per session: Not on file  . Stress: Not on file  Relationships  . Social Herbalist on phone: Not on file    Gets together: Not on file    Attends religious service: Not on file    Active member of club or organization: Not on file    Attends meetings of clubs or organizations: Not on file     Relationship status: Not on file  . Intimate partner violence    Fear of current or ex partner: Not on file    Emotionally abused: Not on file    Physically abused: Not on file    Forced sexual activity: Not on file  Other Topics Concern  . Not on file  Social History Narrative  . Not on file      Review of systems: Review of Systems  Constitutional: Negative for fever and chills.  HENT: Negative.   Eyes: Negative  for blurred vision.  Respiratory: Negative for cough, shortness of breath and wheezing.   Cardiovascular: Negative for chest pain and palpitations.  Gastrointestinal: as per HPI Genitourinary: Negative for dysuria, urgency, frequency and hematuria.  Musculoskeletal: Negative for myalgias, back pain and joint pain.  Skin: Negative for itching and rash.  Neurological: Negative for dizziness, tremors, focal weakness, seizures and loss of consciousness.  Endo/Heme/Allergies: Negative for seasonal allergies.  Psychiatric/Behavioral: Negative for depression, suicidal ideas and hallucinations.  All other systems reviewed and are negative.   Physical Exam: Vitals:   03/01/19 1601  BP: 128/72  Pulse: 93  Temp: 98.2 F (36.8 C)   Body mass index is 19.27 kg/m. Gen:      No acute distress HEENT:  EOMI, sclera anicteric Neck:     No masses; no thyromegaly Lungs:    Clear to auscultation bilaterally; normal respiratory effort CV:         Regular rate and rhythm; no murmurs Abd:      + bowel sounds; soft, non-tender; no palpable masses, no distension Ext:    No edema; adequate peripheral perfusion Skin:      Warm and dry; no rash Neuro: alert and oriented x 3 Psych: normal mood and affect Rectal exam: Increased anal sphincter tone, tenderness, posterior midline anal fissure , + small external hemorrhoids and skin tags Anoscopy not performed  Data Reviewed:  Reviewed labs, radiology imaging, old records and pertinent past GI work up   Assessment and  Plan/Recommendations:  38 year old female with chronic anal fissure is post chemical sphincterotomy with Botox 01/27/2019.  Complains of anorectal discomfort and spasms radiating to her thigh, vagina and rectum possible levator spasm  Will refer to pelvic floor physical therapy  Levsin twice daily as needed  Benefiber 2 teaspoons three times daily to prevent constipation  MiraLAX daily, titrate to have 1-2 soft bowel movements daily   Avoid excessive straining with defecation  25 minutes was spent face-to-face with the patient. Greater than 50% of the time used for counseling as well as treatment plan and follow-up. She had multiple questions which were answered to her satisfaction  K. Denzil Magnuson , MD    CC: Holland Commons, FNP

## 2019-03-01 NOTE — Patient Instructions (Signed)
We will refer you to pelvic floor therapy, they will contact you with that appointment  Take Benefiber 2 teaspoons three times daily  Titrate Miralax till you have 1-2 soft BM's per day  We will send Levsin to your pharmacy  I appreciate the  opportunity to care for you  Thank You   Harl Bowie , MD

## 2019-03-02 ENCOUNTER — Telehealth: Payer: Self-pay | Admitting: Gastroenterology

## 2019-03-02 ENCOUNTER — Encounter: Payer: Self-pay | Admitting: Gastroenterology

## 2019-03-02 NOTE — Telephone Encounter (Signed)
The pt would like Dr Silverio Decamp know that she also has spasms with shooting pain in the rectum.  She forgot to mention this at her appt yesterday.  Is she healed enough for PT per Dr Marcello Moores.  The pt states she is not happy with Dr Marcello Moores and wants Dr Silverio Decamp to be aware.

## 2019-03-02 NOTE — Telephone Encounter (Signed)
Patient called in wanting to speak with the nurse. She stated that it was in regards to her appointment yesterday. She had some additional things to add that she did not discuss with me. Please call thanks

## 2019-03-02 NOTE — Telephone Encounter (Signed)
I sent a message to Dr. Marcello Moores to check if there is any contraindication from her standpoint.  We already sent a referral for pelvic floor PT.  We can update patient next week once we hear back from Dr. Marcello Moores.  Thanks

## 2019-03-02 NOTE — Telephone Encounter (Signed)
Noted  

## 2019-03-07 NOTE — Telephone Encounter (Signed)
Patient is advised that the surgeon has been consulted and PT has been instructed on her limitations for therapy. Dr Marcello Moores recommends no internal stimulation be performed.

## 2019-03-14 ENCOUNTER — Encounter

## 2019-03-15 ENCOUNTER — Ambulatory Visit: Payer: BC Managed Care – PPO | Attending: Gastroenterology | Admitting: Physical Therapy

## 2019-03-15 ENCOUNTER — Other Ambulatory Visit: Payer: Self-pay

## 2019-03-15 ENCOUNTER — Encounter: Payer: Self-pay | Admitting: Physical Therapy

## 2019-03-15 DIAGNOSIS — R252 Cramp and spasm: Secondary | ICD-10-CM | POA: Diagnosis not present

## 2019-03-15 DIAGNOSIS — G8929 Other chronic pain: Secondary | ICD-10-CM | POA: Diagnosis not present

## 2019-03-15 DIAGNOSIS — M6281 Muscle weakness (generalized): Secondary | ICD-10-CM | POA: Diagnosis not present

## 2019-03-15 DIAGNOSIS — M545 Low back pain: Secondary | ICD-10-CM | POA: Insufficient documentation

## 2019-03-15 NOTE — Therapy (Signed)
[facility] [address], [place], [place], [address] Phone: [phone]   Fax:  [phone]  Physical Therapy Evaluation  Patient Details  Name: Shirley Webb MRN: [id number] Date of Birth: July 29, 1981 Referring Provider (PT): Mauri Pole, MD   Encounter Date: 03/15/2019  PT End of Session - 03/15/19 1423    Visit Number  1    Date for PT Re-Evaluation  06/07/19    PT Start Time  8315    PT Stop Time  1528    PT Time Calculation (min)  65 min    Activity Tolerance  Patient tolerated treatment well    Behavior During Therapy  [facility] for tasks assessed/performed       Past Medical History:  Diagnosis Date  . Anal fissure   . Anxiety and depression   . Cold hands and feet   . Dyspnea   . Generalized weakness   . GERD (gastroesophageal reflux disease)    occ  . Hemorrhoids   . History of appendicitis   . History of fracture of right ankle   . History of vasculitis    Childhood  . Low back pain   . Migraines   . 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 Wears contact lenses   . Wears glasses   . Wrist fracture    Left    Past Surgical History:  Procedure Laterality Date  . COLONOSCOPY W/ POLYPECTOMY  08/2018  . FRACTURE SURGERY     Ankle fracture  . hardware removal Right    Ankle  . LAPAROSCOPIC APPENDECTOMY N/A 01/03/2017   Procedure: APPENDECTOMY LAPAROSCOPIC;  Surgeon: Armandina Gemma, MD;  Location: [facility];  Service: General;  Laterality: N/A;  . SPHINCTEROTOMY N/A 01/27/2019   Procedure: CHEMICAL SPHINCTEROTOMY BOTOX;  Surgeon: Leighton Ruff, MD;  Location: [facility];  Service: General;  Laterality: N/A;    There were no vitals filed for this visit.   Subjective Assessment - 03/15/19 1426    Subjective  Pt has had hemorrhoid since 24 then had fissure diagnosis since Dec. Pt states it was bleeding a few weeks ago again.  Then at the end of April started having spasms  after a hike.  Spasms last hours and after BM it happens, worse if standing or moving.  Takes 6-8 hours to calm down and ice helps.  Very occasionally a spasms    Pertinent History  had surgery for fissure, chronic pain    Limitations  Sitting;Standing;Walking    How long can you sit comfortably?  cannot sit on bottom - leans to the side    How long can you stand comfortably?  feels burning/cramping after a few minutes    How long can you walk comfortably?  feels burning immediately    Patient Stated Goals  be able to not have the horrible spasms after a BM so she can return to work soon    Currently in Pain?  Yes    Pain Score  2    4/10 when standing today; shoots up to 9/10   Pain Location  Rectum   and around the anus   Pain Orientation  Mid    Pain Descriptors / Indicators  Burning;Stabbing;Pins and needles;Radiating;Heaviness;Pressure    Pain Type  Chronic pain    Pain Radiating Towards  into back down to feet, forward into the vagina    Aggravating Factors   standing - feels like  gravity pulling down; having a BM is a definite - multiple makes it even worse    Pain Relieving Factors  ice or heat depending on the moment, lying on belly with pillows under pelvis, epsom salt baths    Effect of Pain on Daily Activities  can't do anything normal    Multiple Pain Sites  No         [facility] PT Assessment - 03/15/19 0001      Assessment   Medical Diagnosis  M62.838 (ICD-10-CM) - Levator spasm    Referring Provider (PT)  Mauri Pole, MD    Onset Date/Surgical Date  12/26/18   when the severe spasms started   Prior Therapy  No      Precautions   Precautions  None      Restrictions   Weight Bearing Restrictions  No      Balance Screen   Has the patient fallen in the past 6 months  No      [place] residence    Living Arrangements  Spouse/significant other   boyfriend     Prior Function   Level of Independence  Independent     Vocation  On disability    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      Cognition   Overall Cognitive Status  Within Functional Limits for tasks assessed      Observation/Other Assessments   Observations  shallow breather      Posture/Postural Control   Posture/Postural Control  Postural limitations    Postural Limitations  Left pelvic obliquity;Decreased lumbar lordosis   anterior rotation of Left hip   Posture Comments  sits on side and switches side to side      ROM / Strength   AROM / PROM / Strength  Strength;PROM      PROM   Overall PROM Comments  Lt hip ER 20% limited; Rt hip 25% limited      Strength   Overall Strength Comments  hip extension 3/5 bilaterally      Flexibility   Soft Tissue Assessment /Muscle Length  yes    Hamstrings  Rt 25% limited; Lt 20% limited      Palpation   Palpation comment  tight and TPP lumbar paraspinals and glutes Lt>Rt; hypomobility lumbar L3-4 with A/P pressure      Ambulation/Gait   Gait Pattern  Decreased stride length   fearful of too much movement               Objective measurements completed on examination: See above findings.    Pelvic Floor Special Questions - 03/15/19 0001    Prior Pelvic/Prostate Exam  Yes    Are you Pregnant or attempting pregnancy?  No    Prior Pregnancies  No    Currently Sexually Active  No    Marinoff Scale  no problems    Urinary Leakage  No    Fecal incontinence  Yes   now on mirilax and has had botox   Falling out feeling (prolapse)  Yes    Activities that cause feeling of prolapse  standing               PT Education - 03/15/19 1534    Education Details  Access Code: [email or web address]    Person(s) Educated  Patient    Methods  Explanation;Demonstration;Handout;Verbal cues    Comprehension  Verbalized understanding;Returned demonstration       PT Short  Term Goals - 03/15/19 1801      PT SHORT TERM GOAL #1   Title  Pt will notice muscle spasms reduced by 25%  frequency    Baseline  every day throughout the day    Time  4    Period  Weeks    Status  New    Target Date  04/12/19      PT SHORT TERM GOAL #2   Title  Pt will no have cramps that go all the way to her feet due to knowing how to stretch    Time  4    Period  Weeks    Status  New    Target Date  04/12/19        PT Long Term Goals - 03/15/19 1529      PT LONG TERM GOAL #1   Title  Pt will report regular and predictable BM so she can feel comfortable returning to work    Time  12    Period  Weeks    Status  New    Target Date  06/07/19      PT LONG TERM GOAL #2   Title  Pt will be confidnet and independent with final HEP so she can continue to promote pelvic floor soft tissue health without increased muscle spasms    Time  12    Period  Weeks    Status  New    Target Date  06/07/19      PT LONG TERM GOAL #3   Title  Pt will report muscle spasm frequency reduced by 50% in order to be able to return to work    Time  12    Period  Weeks    Status  New    Target Date  06/07/19      PT LONG TERM GOAL #4   Title  Pt will be able to manage pain more efficiently so she can manage to work a full 8 hour shift    Baseline  currently spends her entire day on pain management and diet    Time  12    Period  Weeks    Status  New    Target Date  06/07/19      PT LONG TERM GOAL #5   Title  Pt will report 50% less pain after a BM due to improved ability to control pelvic floor muscles    Time  12    Period  Weeks    Status  New    Target Date  06/07/19             Plan - 03/15/19 1923    Clinical Impression Statement  Pt presents to skilled PT due to levator muscle spasm that has been worsening since December.  It has been even worse since late April to the point where patient can not return to work at this time.  Pt has decreased ROM bilateral hips.  Decreased hamstring length.  Muscle tension and TTP lumbar and glutes. She has abnormal posture mentioned above.  Pt has  shortened step length and is fearful of too much movement due to pain.  Every movement is increasing her pain at this time.  Pt has weakness of bilateral hip extension. Internal assessment is deferred due to unhealed fissures.  She will benefit from skilled PT to address impairments to promote soft tissue healing and increased function.    Personal Factors and Comorbidities  Comorbidity 2  Comorbidities  chronic pain, anal fissure that is not healed    Examination-Activity Limitations  Toileting;Sit;Stand;Locomotion Level    Examination-Participation Restrictions  --   work and exercise   Stability/Clinical Decision Making  Evolving/Moderate complexity    Clinical Decision Making  Moderate    Rehab Potential  Good    PT Frequency  2x / week    PT Duration  12 weeks    PT Treatment/Interventions  ADLs/Self Care Home Management;Biofeedback;Cryotherapy;Electrical Stimulation;Moist Heat;Therapeutic activities;Therapeutic exercise;Neuromuscular re-education;Patient/family education;Manual techniques;Passive range of motion;Dry needling    PT Next Visit Plan  DN to lumbar?; rolling out LE and feet, review breathing and stretching, biofeedback    PT Home Exercise Plan  Access Code: [email or web address]    Consulted and Agree with Plan of Care  Patient       Patient will benefit from skilled therapeutic intervention in order to improve the following deficits and impairments:  Increased muscle spasms, Decreased range of motion, Decreased strength, Increased fascial restricitons, Pain  Visit Diagnosis: 1. Cramp and spasm   2. Chronic left-sided low back pain without sciatica   3. Muscle weakness (generalized)        Problem List Patient Active Problem List   Diagnosis Date Noted  . Appendicitis, acute 01/03/2017  . Acute appendicitis 01/03/2017  . Paresthesia 03/01/2014  . ANKLE, PAIN 04/04/2008  . NEURALGIA 04/04/2008    Jule Ser, PT 03/15/2019, 7:32 PM  [facility] [address], [place], [place], [address] Phone: [phone]   Fax:  [phone]  Name: Shirley Webb MRN: [id number] Date of Birth: 1980-10-19

## 2019-03-15 NOTE — Patient Instructions (Signed)
Access Code: [email or web address]  URL: [email or web address]  Date: 03/15/2019  Prepared by: Jari Favre   Exercises  Supine Diaphragmatic Breathing - 10 reps - 1 sets - 3x daily - 7x weekly

## 2019-03-17 ENCOUNTER — Ambulatory Visit: Payer: BC Managed Care – PPO | Admitting: Physical Therapy

## 2019-03-17 ENCOUNTER — Other Ambulatory Visit: Payer: Self-pay

## 2019-03-17 ENCOUNTER — Encounter: Payer: Self-pay | Admitting: Physical Therapy

## 2019-03-17 DIAGNOSIS — G8929 Other chronic pain: Secondary | ICD-10-CM

## 2019-03-17 DIAGNOSIS — M545 Low back pain, unspecified: Secondary | ICD-10-CM

## 2019-03-17 DIAGNOSIS — R252 Cramp and spasm: Secondary | ICD-10-CM | POA: Diagnosis not present

## 2019-03-17 DIAGNOSIS — M6281 Muscle weakness (generalized): Secondary | ICD-10-CM

## 2019-03-17 NOTE — Therapy (Signed)
[facility] [address] [place], [place], [address] Phone: [phone]   Fax:  [phone]  Physical Therapy Treatment  Patient Details  Name: Shirley Webb MRN: [id number] Date of Birth: [birth year] Referring Provider (Shirley Webb): Mauri Pole, MD   Encounter Date: 03/17/2019  Shirley Webb End of Session - 03/17/19 1432    Visit Number  2    Date for Shirley Webb Re-Evaluation  06/07/19    Shirley Webb Start Time  1432    Shirley Webb Stop Time  1525   dry needling   Shirley Webb Time Calculation (min)  53 min    Activity Tolerance  Patient tolerated treatment well    Behavior During Therapy  [facility] for tasks assessed/performed       Past Medical History:  Diagnosis Date  . Anal fissure   . Anxiety and depression   . Cold hands and feet   . Dyspnea   . Generalized weakness   . GERD (gastroesophageal reflux disease)    occ  . Hemorrhoids   . History of appendicitis   . History of fracture of right ankle   . History of vasculitis    Childhood  . Low back pain   . Migraines   . 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 Wears contact lenses   . Wears glasses   . Wrist fracture    Left    Past Surgical History:  Procedure Laterality Date  . COLONOSCOPY W/ POLYPECTOMY  08/2018  . FRACTURE SURGERY     Ankle fracture  . hardware removal Right    Ankle  . LAPAROSCOPIC APPENDECTOMY N/A 01/03/2017   Procedure: APPENDECTOMY LAPAROSCOPIC;  Surgeon: Armandina Gemma, MD;  Location: [facility];  Service: General;  Laterality: N/A;  . SPHINCTEROTOMY N/A 01/27/2019   Procedure: CHEMICAL SPHINCTEROTOMY BOTOX;  Surgeon: Leighton Ruff, MD;  Location: [facility];  Service: General;  Laterality: N/A;    There were no vitals filed for this visit.  Subjective Assessment - 03/17/19 1620    Subjective  Shirley Webb reports she has had a bad morning.  She entered clinic with very slow gait speed and short step length.    Currently in Pain?  Yes   did not ask number    Pain Location  Rectum    Pain Descriptors / Indicators  Sharp    Pain Type  Chronic pain    Multiple Pain Sites  No                       [facility] Adult Shirley Webb Treatment/Exercise - 03/17/19 0001      Neuro Re-ed    Neuro Re-ed Details   breathing technique in different positions - prone on pillows and prone on pball      Manual Therapy   Manual Therapy  Soft tissue mobilization;Myofascial release    Soft tissue mobilization  bilateral plantar flexors, calves, hamstring, glutes, lumbar and thoracic paraspinals    Myofascial Release  around coccyx       Trigger Point Dry Needling - 03/17/19 0001    Consent Given?  Yes    Education Handout Provided  Previously provided   verbally reviewed - has had DN previously   Muscles Treated Back/Hip  --   thoracic multifidi T10-12 bilateral   Other Dry Needling  T10-12 multifidi             Shirley Webb Short Term Goals - 03/15/19 1801  Shirley Webb SHORT TERM GOAL #1   Title  Shirley Webb will notice muscle spasms reduced by 25% frequency    Baseline  every day throughout the day    Time  4    Period  Weeks    Status  New    Target Date  04/12/19      Shirley Webb SHORT TERM GOAL #2   Title  Shirley Webb will no have cramps that go all the way to her feet due to knowing how to stretch    Time  4    Period  Weeks    Status  New    Target Date  04/12/19        Shirley Webb Long Term Goals - 03/15/19 1529      Shirley Webb LONG TERM GOAL #1   Title  Shirley Webb will report regular and predictable BM so she can feel comfortable returning to work    Time  12    Period  Weeks    Status  New    Target Date  06/07/19      Shirley Webb LONG TERM GOAL #2   Title  Shirley Webb will be confidnet and independent with final HEP so she can continue to promote pelvic floor soft tissue health without increased muscle spasms    Time  12    Period  Weeks    Status  New    Target Date  06/07/19      Shirley Webb LONG TERM GOAL #3   Title  Shirley Webb will report muscle spasm frequency reduced by 50% in order to be able to return  to work    Time  12    Period  Weeks    Status  New    Target Date  06/07/19      Shirley Webb LONG TERM GOAL #4   Title  Shirley Webb will be able to manage pain more efficiently so she can manage to work a full 8 hour shift    Baseline  currently spends her entire day on pain management and diet    Time  12    Period  Weeks    Status  New    Target Date  06/07/19      Shirley Webb LONG TERM GOAL #5   Title  Shirley Webb will report 50% less pain after a BM due to improved ability to control pelvic floor muscles    Time  12    Period  Weeks    Status  New    Target Date  06/07/19            Plan - 03/17/19 1610    Clinical Impression Statement  Shirley Webb had reduced tension through back after manual treatment and dry needling.  She has release of tension and trigger points through thoracic and lumbar paraspinlas, glutes, hamstrings, plantar flexors.  Shirley Webb was able to get some relief when lying prone with pillows under hips.  She also felt a good stretch lying on small pball doing breathing technique.  Shirley Webb did get some relief doing the breathing after he BMs today.  She has not met any goals yet since eval.  She will continue to benefit from skilled Shirley Webb to continue to work on pain management and return to maximum function.    Shirley Webb Treatment/Interventions  ADLs/Self Care Home Management;Biofeedback;Cryotherapy;Electrical Stimulation;Moist Heat;Therapeutic activities;Therapeutic exercise;Neuromuscular re-education;Patient/family education;Manual techniques;Passive range of motion;Dry needling    Shirley Webb Next Visit Plan  assess response to DN; rolling out LE and feet, review breathing and stretching, biofeedback  Shirley Webb Home Exercise Plan  Access Code: [email or web address]    Consulted and Agree with Plan of Care  Patient       Patient will benefit from skilled therapeutic intervention in order to improve the following deficits and impairments:  Increased muscle spasms, Decreased range of motion, Decreased strength, Increased fascial restricitons,  Pain  Visit Diagnosis: 1. Cramp and spasm   2. Chronic left-sided low back pain without sciatica   3. Muscle weakness (generalized)        Problem List Patient Active Problem List   Diagnosis Date Noted  . Appendicitis, acute 01/03/2017  . Acute appendicitis 01/03/2017  . Paresthesia 03/01/2014  . ANKLE, PAIN 04/04/2008  . NEURALGIA 04/04/2008    Shirley Webb, Shirley Webb 03/17/2019, 4:34 PM  [facility] [address] [place], [place], [address] Phone: [phone]   Fax:  [phone]  Name: Shirley Webb MRN: [id number] Date of Birth: Oct 23, 1980

## 2019-03-20 ENCOUNTER — Other Ambulatory Visit: Payer: Self-pay

## 2019-03-20 ENCOUNTER — Ambulatory Visit: Payer: BC Managed Care – PPO | Admitting: Physical Therapy

## 2019-03-20 ENCOUNTER — Encounter: Payer: Self-pay | Admitting: Physical Therapy

## 2019-03-20 DIAGNOSIS — M6281 Muscle weakness (generalized): Secondary | ICD-10-CM

## 2019-03-20 DIAGNOSIS — M545 Low back pain, unspecified: Secondary | ICD-10-CM

## 2019-03-20 DIAGNOSIS — R252 Cramp and spasm: Secondary | ICD-10-CM

## 2019-03-20 DIAGNOSIS — G8929 Other chronic pain: Secondary | ICD-10-CM | POA: Diagnosis not present

## 2019-03-20 NOTE — Therapy (Signed)
[facility] [address] [place], [place], [address] Phone: [phone]   Fax:  [phone]  Physical Therapy Treatment  Patient Details  Name: Shirley Webb MRN: [id number] Date of Birth: 24-Oct-1980 Referring Provider (PT): Mauri Pole, MD   Encounter Date: 03/20/2019  PT End of Session - 03/20/19 1908    Visit Number  3    Date for PT Re-Evaluation  06/07/19    PT Start Time  1700    PT Stop Time  1758    PT Time Calculation (min)  58 min    Activity Tolerance  Patient tolerated treatment well    Behavior During Therapy  [facility] for tasks assessed/performed       Past Medical History:  Diagnosis Date  . Anal fissure   . Anxiety and depression   . Cold hands and feet   . Dyspnea   . Generalized weakness   . GERD (gastroesophageal reflux disease)    occ  . Hemorrhoids   . History of appendicitis   . History of fracture of right ankle   . History of vasculitis    Childhood  . Low back pain   . Migraines   . 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 Wears contact lenses   . Wears glasses   . Wrist fracture    Left    Past Surgical History:  Procedure Laterality Date  . COLONOSCOPY W/ POLYPECTOMY  08/2018  . FRACTURE SURGERY     Ankle fracture  . hardware removal Right    Ankle  . LAPAROSCOPIC APPENDECTOMY N/A 01/03/2017   Procedure: APPENDECTOMY LAPAROSCOPIC;  Surgeon: Armandina Gemma, MD;  Location: [facility];  Service: General;  Laterality: N/A;  . SPHINCTEROTOMY N/A 01/27/2019   Procedure: CHEMICAL SPHINCTEROTOMY BOTOX;  Surgeon: Leighton Ruff, MD;  Location: [facility];  Service: General;  Laterality: N/A;    There were no vitals filed for this visit.  Subjective Assessment - 03/20/19 1702    Subjective  After I left on Friday the spots felt better.  Saturday was not great.  Sunday was a good day because I had no BM.  Today I stretched after the toileting but did not take  a bath.  I feel localized pain on the areas that have the fissures.    Patient Stated Goals  be able to not have the horrible spasms after a BM so she can return to work soon    Currently in Pain?  Yes    Pain Score  7    lying down; it has been up to 10/10   Pain Location  Rectum    Pain Orientation  Mid    Pain Descriptors / Indicators  Burning    Pain Type  Chronic pain    Pain Frequency  Constant    Aggravating Factors   any movement    Multiple Pain Sites  No                       [facility] Adult PT Treatment/Exercise - 03/20/19 0001      Self-Care   Self-Care  Other Self-Care Comments    Other Self-Care Comments   info on TENS and fascial roller to roll out feet and LE      Neuro Re-ed    Neuro Re-ed Details   cued to breathe in through nose slowly      Modalities  Modalities  Moist Heat;Electrical Stimulation      Moist Heat Therapy   Number Minutes Moist Heat  30 Minutes    Moist Heat Location  Lumbar Spine      Electrical Stimulation   Electrical Stimulation Location  lumbar down to coccyx    Electrical Stimulation Action  IFC    Electrical Stimulation Parameters  to tolerance    Electrical Stimulation Goals  Pain      Manual Therapy   Manual therapy comments  prone with heat during    Soft tissue mobilization  bilateral plantar flexors, calves, hamstring, glutes, lumbar and thoracic paraspinals               PT Short Term Goals - 03/20/19 1904      PT SHORT TERM GOAL #1   Title  Pt will notice muscle spasms reduced by 25% frequency    Status  On-going      PT SHORT TERM GOAL #2   Title  Pt will no have cramps that go all the way to her feet due to knowing how to stretch    Status  On-going        PT Long Term Goals - 03/15/19 1529      PT LONG TERM GOAL #1   Title  Pt will report regular and predictable BM so she can feel comfortable returning to work    Time  12    Period  Weeks    Status  New    Target Date  06/07/19       PT LONG TERM GOAL #2   Title  Pt will be confidnet and independent with final HEP so she can continue to promote pelvic floor soft tissue health without increased muscle spasms    Time  12    Period  Weeks    Status  New    Target Date  06/07/19      PT LONG TERM GOAL #3   Title  Pt will report muscle spasm frequency reduced by 50% in order to be able to return to work    Time  12    Period  Weeks    Status  New    Target Date  06/07/19      PT LONG TERM GOAL #4   Title  Pt will be able to manage pain more efficiently so she can manage to work a full 8 hour shift    Baseline  currently spends her entire day on pain management and diet    Time  12    Period  Weeks    Status  New    Target Date  06/07/19      PT LONG TERM GOAL #5   Title  Pt will report 50% less pain after a BM due to improved ability to control pelvic floor muscles    Time  12    Period  Weeks    Status  New    Target Date  06/07/19            Plan - 03/20/19 1749    Clinical Impression Statement  Pt was in more pain today.  She tried to not have a bath after her BM this morning and unsure if that was the cause the increased spasms.  Today focused on reduced fascial reastriction and pain management. Pt will benefit from skilled PT to address muscle spasms andmanage pain in order to promote healing.    PT Treatment/Interventions  ADLs/Self Care Home Management;Biofeedback;Cryotherapy;Electrical Stimulation;Moist Heat;Therapeutic activities;Therapeutic exercise;Neuromuscular re-education;Patient/family education;Manual techniques;Passive range of motion;Dry needling    PT Next Visit Plan  f/u on TENS unit for home use; stretches with breathing    PT Home Exercise Plan  Access Code: [email or web address]    Consulted and Agree with Plan of Care  Patient       Patient will benefit from skilled therapeutic intervention in order to improve the following deficits and impairments:  Increased muscle spasms, Decreased range  of motion, Decreased strength, Increased fascial restricitons, Pain  Visit Diagnosis: 1. Cramp and spasm   2. Chronic left-sided low back pain without sciatica   3. Muscle weakness (generalized)        Problem List Patient Active Problem List   Diagnosis Date Noted  . Appendicitis, acute 01/03/2017  . Acute appendicitis 01/03/2017  . Paresthesia 03/01/2014  . ANKLE, PAIN 04/04/2008  . NEURALGIA 04/04/2008    Shirley Webb 03/20/2019, 7:10 PM  [facility] [address], [place], [place], [address] Phone: [phone]   Fax:  [phone]  Name: Shirley Webb MRN: [id number] Date of Birth: 02-19-1981

## 2019-03-21 ENCOUNTER — Telehealth: Payer: Self-pay | Admitting: Gastroenterology

## 2019-03-21 NOTE — Telephone Encounter (Signed)
Spoke with the patient. She is in agreement with this plan. Called Dr Geronimo Boot office [phone]. Appointment first available 05/05/2019 at [facility]. Patient is on the "wait list" and may call daily to ask if there are any cancellations.  Records faxed to [phone].

## 2019-03-21 NOTE — Telephone Encounter (Signed)
Given she has failed medical management of anal fissure and also Botox injection by Dr. Marcello Moores, will refer to [facility] colorectal surgeon Dr. Renelda Mom

## 2019-03-21 NOTE — Telephone Encounter (Signed)
Patient called and has a question about her fissure and would like to speak to someone.

## 2019-03-21 NOTE — Telephone Encounter (Signed)
Patient calls with concerns that she is losing ground in her healing process. Reports she has a "rough" area on the right side of the "sphincter" that is also tender. She is maintaining soft unformed BM's triturating Miralax. The BM will cause a "burning sensation" that feels "worse on those areas." Today she had blood on the tissue. This is a new symptom.  She reports increasing difficulty with pain control.

## 2019-03-23 ENCOUNTER — Encounter

## 2019-03-27 NOTE — Telephone Encounter (Signed)
Error

## 2019-03-28 ENCOUNTER — Ambulatory Visit: Payer: BC Managed Care – PPO | Admitting: Gastroenterology

## 2019-03-29 ENCOUNTER — Ambulatory Visit: Payer: BC Managed Care – PPO | Admitting: Physical Therapy

## 2019-03-31 ENCOUNTER — Encounter: Payer: Self-pay | Admitting: Physical Therapy

## 2019-03-31 ENCOUNTER — Ambulatory Visit: Payer: BC Managed Care – PPO | Admitting: Physical Therapy

## 2019-03-31 ENCOUNTER — Other Ambulatory Visit: Payer: Self-pay

## 2019-03-31 DIAGNOSIS — R252 Cramp and spasm: Secondary | ICD-10-CM | POA: Diagnosis not present

## 2019-03-31 DIAGNOSIS — M545 Low back pain, unspecified: Secondary | ICD-10-CM

## 2019-03-31 DIAGNOSIS — M6281 Muscle weakness (generalized): Secondary | ICD-10-CM | POA: Diagnosis not present

## 2019-03-31 DIAGNOSIS — G8929 Other chronic pain: Secondary | ICD-10-CM | POA: Diagnosis not present

## 2019-03-31 NOTE — Patient Instructions (Signed)
Access Code: [email or web address]  URL: [email or web address]  Date: 03/31/2019  Prepared by: Jari Favre   Exercises  Supine Diaphragmatic Breathing - 10 reps - 1 sets - 3x daily - 7x weekly  Supine Piriformis Stretch - 3 reps - 1 sets - 30 sec hold - 1x daily - 7x weekly  Supine Hamstring Stretch with Strap - 3 reps - 1 sets - 30 sec hold - 1x daily - 7x weekly  Supine Lower Trunk Rotation - 10 reps - 1 sets - 5 sec hold - 1x daily - 7x weekly  Supine Butterfly Groin Stretch - 3 reps - 1 sets - 30 sec hold - 1x daily - 7x weekly  Supine Hip Internal and External Rotation - 10 reps - 1 sets - 5 sec hold - 1x daily - 7x weekly

## 2019-03-31 NOTE — Therapy (Signed)
[facility] [address], [place], [place], [address] Phone: [phone]   Fax:  [phone]  Physical Therapy Treatment  Patient Details  Name: DARSI TIEN MRN: [id number] Date of Birth: 16-Oct-1980 Referring Provider (PT): Mauri Pole, MD   Encounter Date: 03/31/2019  PT End of Session - 03/31/19 1422    Visit Number  4    Date for PT Re-Evaluation  06/07/19    PT Start Time  4259    PT Stop Time  1508    PT Time Calculation (min)  46 min    Activity Tolerance  Patient tolerated treatment well    Behavior During Therapy  [facility] for tasks assessed/performed       Past Medical History:  Diagnosis Date  . Anal fissure   . Anxiety and depression   . Cold hands and feet   . Dyspnea   . Generalized weakness   . GERD (gastroesophageal reflux disease)    occ  . Hemorrhoids   . History of appendicitis   . History of fracture of right ankle   . History of vasculitis    Childhood  . Low back pain   . Migraines   . 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 Wears contact lenses   . Wears glasses   . Wrist fracture    Left    Past Surgical History:  Procedure Laterality Date  . COLONOSCOPY W/ POLYPECTOMY  08/2018  . FRACTURE SURGERY     Ankle fracture  . hardware removal Right    Ankle  . LAPAROSCOPIC APPENDECTOMY N/A 01/03/2017   Procedure: APPENDECTOMY LAPAROSCOPIC;  Surgeon: Armandina Gemma, MD;  Location: [facility];  Service: General;  Laterality: N/A;  . SPHINCTEROTOMY N/A 01/27/2019   Procedure: CHEMICAL SPHINCTEROTOMY BOTOX;  Surgeon: Leighton Ruff, MD;  Location: [facility];  Service: General;  Laterality: N/A;    There were no vitals filed for this visit.  Subjective Assessment - 03/31/19 1435    Subjective  Today is a better. I had the worst day last Tuesday.    Currently in Pain?  Yes    Pain Score  2    getting up to 7 with standing   Pain Location  Rectum    Pain  Orientation  Mid    Pain Descriptors / Indicators  Burning    Pain Type  Chronic pain    Pain Frequency  Intermittent    Multiple Pain Sites  No                       [facility] Adult PT Treatment/Exercise - 03/31/19 0001      Exercises   Exercises  Lumbar      Lumbar Exercises: Stretches   Active Hamstring Stretch  Right;Left;3 reps;30 seconds    Lower Trunk Rotation  5 reps;10 seconds    Lower Trunk Rotation Limitations  windshield wipers for IR/ER bilat hip - 10x 5 sec    Piriformis Stretch  Right;Left;3 reps;30 seconds    Other Lumbar Stretch Exercise  modified child's pose on pillow    Other Lumbar Stretch Exercise  butterfly with pillows; prone on small ball             PT Education - 03/31/19 1505    Education Details  Access Code: [email or web address]    Person(s) Educated  Patient    Methods  Explanation;Demonstration;Tactile cues;Verbal cues;Handout  Comprehension  Verbalized understanding;Returned demonstration       PT Short Term Goals - 03/31/19 1506      PT SHORT TERM GOAL #1   Title  Pt will notice muscle spasms reduced by 25% frequency    Baseline  Not getting better, has appointment to see a colorectal specialist    Status  On-going      PT SHORT TERM GOAL #2   Title  Pt will no have cramps that go all the way to her feet due to knowing how to stretch    Status  On-going        PT Long Term Goals - 03/15/19 1529      PT LONG TERM GOAL #1   Title  Pt will report regular and predictable BM so she can feel comfortable returning to work    Time  12    Period  Weeks    Status  New    Target Date  06/07/19      PT LONG TERM GOAL #2   Title  Pt will be confidnet and independent with final HEP so she can continue to promote pelvic floor soft tissue health without increased muscle spasms    Time  12    Period  Weeks    Status  New    Target Date  06/07/19      PT LONG TERM GOAL #3   Title  Pt will report muscle spasm frequency reduced by 50%  in order to be able to return to work    Time  12    Period  Weeks    Status  New    Target Date  06/07/19      PT LONG TERM GOAL #4   Title  Pt will be able to manage pain more efficiently so she can manage to work a full 8 hour shift    Baseline  currently spends her entire day on pain management and diet    Time  12    Period  Weeks    Status  New    Target Date  06/07/19      PT LONG TERM GOAL #5   Title  Pt will report 50% less pain after a BM due to improved ability to control pelvic floor muscles    Time  12    Period  Weeks    Status  New    Target Date  06/07/19            Plan - 03/31/19 1517    Clinical Impression Statement  Pt is still concerned about possibly having worsening of her fissures.  Everything was done to tolerance without increased pain during session today. Pt was given updates to HEP to do stretches at home.  Pt understands that she is not to stretch into pain.  Pt has an appointment with a specialist to find out the status of her fissure healing in one month.  Until then skilled PT is beneficial to continue to assist patient in learning techniques to relax pelvic floor muscles.    Comorbidities  chronic pain, anal fissure that is not healed    PT Treatment/Interventions  ADLs/Self Care Home Management;Biofeedback;Cryotherapy;Electrical Stimulation;Moist Heat;Therapeutic activities;Therapeutic exercise;Neuromuscular re-education;Patient/family education;Manual techniques;Passive range of motion;Dry needling    PT Next Visit Plan  f/u with new stretches with breathing added to HEP; continue stretching and bulging pelvic floor, biofeedback?, stim and heat    PT Home Exercise Plan  Access Code: [email or web address]  Consulted and Agree with Plan of Care  Patient       Patient will benefit from skilled therapeutic intervention in order to improve the following deficits and impairments:  Increased muscle spasms, Decreased range of motion, Decreased strength,  Increased fascial restricitons, Pain  Visit Diagnosis: 1. Cramp and spasm   2. Chronic left-sided low back pain without sciatica   3. Muscle weakness (generalized)        Problem List Patient Active Problem List   Diagnosis Date Noted  . Appendicitis, acute 01/03/2017  . Acute appendicitis 01/03/2017  . Paresthesia 03/01/2014  . ANKLE, PAIN 04/04/2008  . NEURALGIA 04/04/2008    Jule Ser, PT 03/31/2019, 3:24 PM  [facility] [address], [place], [place], [address] Phone: [phone]   Fax:  [phone]  Name: LIVY ROSS MRN: [id number] Date of Birth: 1980-10-17

## 2019-04-04 ENCOUNTER — Ambulatory Visit: Payer: BC Managed Care – PPO | Attending: Gastroenterology | Admitting: Physical Therapy

## 2019-04-04 ENCOUNTER — Other Ambulatory Visit: Payer: Self-pay

## 2019-04-04 ENCOUNTER — Encounter: Payer: Self-pay | Admitting: Physical Therapy

## 2019-04-04 DIAGNOSIS — K6289 Other specified diseases of anus and rectum: Secondary | ICD-10-CM | POA: Diagnosis not present

## 2019-04-04 DIAGNOSIS — G8929 Other chronic pain: Secondary | ICD-10-CM | POA: Insufficient documentation

## 2019-04-04 DIAGNOSIS — M6281 Muscle weakness (generalized): Secondary | ICD-10-CM

## 2019-04-04 DIAGNOSIS — M545 Low back pain, unspecified: Secondary | ICD-10-CM

## 2019-04-04 DIAGNOSIS — R252 Cramp and spasm: Secondary | ICD-10-CM | POA: Insufficient documentation

## 2019-04-04 NOTE — Therapy (Signed)
[facility] [address], [place], [place], [address] Phone: [phone]   Fax:  [phone]  Physical Therapy Treatment  Patient Details  Name: Shirley Webb MRN: [id number] Date of Birth: March 08, 1981 Referring Provider (PT): Mauri Pole, MD   Encounter Date: 04/04/2019  PT End of Session - 04/04/19 1503    Visit Number  5    Date for PT Re-Evaluation  06/07/19    PT Start Time  7017    PT Stop Time  1529    PT Time Calculation (min)  38 min    Activity Tolerance  Patient tolerated treatment well    Behavior During Therapy  [facility] for tasks assessed/performed       Past Medical History:  Diagnosis Date  . Anal fissure   . Anxiety and depression   . Cold hands and feet   . Dyspnea   . Generalized weakness   . GERD (gastroesophageal reflux disease)    occ  . Hemorrhoids   . History of appendicitis   . History of fracture of right ankle   . History of vasculitis    Childhood  . Low back pain   . Migraines   . 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 Wears contact lenses   . Wears glasses   . Wrist fracture    Left    Past Surgical History:  Procedure Laterality Date  . COLONOSCOPY W/ POLYPECTOMY  08/2018  . FRACTURE SURGERY     Ankle fracture  . hardware removal Right    Ankle  . LAPAROSCOPIC APPENDECTOMY N/A 01/03/2017   Procedure: APPENDECTOMY LAPAROSCOPIC;  Surgeon: Armandina Gemma, MD;  Location: [facility];  Service: General;  Laterality: N/A;  . SPHINCTEROTOMY N/A 01/27/2019   Procedure: CHEMICAL SPHINCTEROTOMY BOTOX;  Surgeon: Leighton Ruff, MD;  Location: [facility];  Service: General;  Laterality: N/A;    There were no vitals filed for this visit.  Subjective Assessment - 04/04/19 1454    Subjective  Pt had MD appointment where they discussed that she has anal ulcers. Pt had some bleeding    Patient Stated Goals  be able to not have the horrible spasms after a BM so  she can return to work soon    Currently in Pain?  Yes   not a bad day                      [facility] Adult PT Treatment/Exercise - 04/04/19 0001      Neuro Re-ed    Neuro Re-ed Details   breathing and bulging in supine and in quadruped      Lumbar Exercises: Stretches   Lower Trunk Rotation Limitations  windshield wipers for IR/ER bilat hip - 10x 5 sec   tried just doing IR then ER separately to avoid rotation   Piriformis Stretch  Right;Left;3 reps;30 seconds      Lumbar Exercises: Quadruped   Other Quadruped Lumbar Exercises  rocking with breathing 15 x               PT Short Term Goals - 03/31/19 1506      PT SHORT TERM GOAL #1   Title  Pt will notice muscle spasms reduced by 25% frequency    Baseline  Not getting better, has appointment to see a colorectal specialist    Status  On-going      PT SHORT TERM GOAL #2  Title  Pt will no have cramps that go all the way to her feet due to knowing how to stretch    Status  On-going        PT Long Term Goals - 03/15/19 1529      PT LONG TERM GOAL #1   Title  Pt will report regular and predictable BM so she can feel comfortable returning to work    Time  12    Period  Weeks    Status  New    Target Date  06/07/19      PT LONG TERM GOAL #2   Title  Pt will be confidnet and independent with final HEP so she can continue to promote pelvic floor soft tissue health without increased muscle spasms    Time  12    Period  Weeks    Status  New    Target Date  06/07/19      PT LONG TERM GOAL #3   Title  Pt will report muscle spasm frequency reduced by 50% in order to be able to return to work    Time  12    Period  Weeks    Status  New    Target Date  06/07/19      PT LONG TERM GOAL #4   Title  Pt will be able to manage pain more efficiently so she can manage to work a full 8 hour shift    Baseline  currently spends her entire day on pain management and diet    Time  12    Period  Weeks    Status   New    Target Date  06/07/19      PT LONG TERM GOAL #5   Title  Pt will report 50% less pain after a BM due to improved ability to control pelvic floor muscles    Time  12    Period  Weeks    Status  New    Target Date  06/07/19            Plan - 04/04/19 1531    Clinical Impression Statement  pt was able to demonstrate correctly doing the breathing technique without  clenching.  She needed some tactile cues to ensure she was doing this correctly and verbal cues to inhale slowly.  Pt was able to add rocking in quadruped to HEP.  Pt will benefit from skilled PT to continue to work on relaxation techniques and incorporate core strength as able.    PT Treatment/Interventions  ADLs/Self Care Home Management;Biofeedback;Cryotherapy;Electrical Stimulation;Moist Heat;Therapeutic activities;Therapeutic exercise;Neuromuscular re-education;Patient/family education;Manual techniques;Passive range of motion;Dry needling    PT Next Visit Plan  f/u with new stretches with breathing added to HEP; continue stretching and bulging pelvic floor, biofeedback?, stim and heat    PT Home Exercise Plan  Access Code: [email or web address]    Consulted and Agree with Plan of Care  Patient       Patient will benefit from skilled therapeutic intervention in order to improve the following deficits and impairments:  Increased muscle spasms, Decreased range of motion, Decreased strength, Increased fascial restricitons, Pain  Visit Diagnosis: 1. Cramp and spasm   2. Chronic left-sided low back pain without sciatica   3. Muscle weakness (generalized)        Problem List Patient Active Problem List   Diagnosis Date Noted  . Appendicitis, acute 01/03/2017  . Acute appendicitis 01/03/2017  . Paresthesia 03/01/2014  . ANKLE, PAIN 04/04/2008  .  NEURALGIA 04/04/2008    Jule Ser, PT 04/04/2019, 3:36 PM  [facility] [address], [place], [place],  [address] Phone: [phone]   Fax:  [phone]  Name: Shirley Webb MRN: [id number] Date of Birth: 21-Jul-1981

## 2019-04-06 ENCOUNTER — Encounter: Payer: Self-pay | Admitting: Physical Therapy

## 2019-04-06 ENCOUNTER — Other Ambulatory Visit: Payer: Self-pay

## 2019-04-06 ENCOUNTER — Ambulatory Visit: Payer: BC Managed Care – PPO | Admitting: Physical Therapy

## 2019-04-06 DIAGNOSIS — R252 Cramp and spasm: Secondary | ICD-10-CM | POA: Diagnosis not present

## 2019-04-06 DIAGNOSIS — M6281 Muscle weakness (generalized): Secondary | ICD-10-CM | POA: Diagnosis not present

## 2019-04-06 DIAGNOSIS — G8929 Other chronic pain: Secondary | ICD-10-CM

## 2019-04-06 DIAGNOSIS — M545 Low back pain: Secondary | ICD-10-CM | POA: Diagnosis not present

## 2019-04-06 NOTE — Therapy (Signed)
[facility] [address] [place], [place], [address] Phone: [phone]   Fax:  [phone]  Physical Therapy Treatment  Patient Details  Name: Shirley Webb MRN: [id number] Date of Birth: 30-Apr-1981 Referring Provider (PT): Mauri Pole, MD   Encounter Date: 04/06/2019  PT End of Session - 04/06/19 1615    Visit Number  6    Date for PT Re-Evaluation  06/07/19    PT Start Time  9371    PT Stop Time  1700    PT Time Calculation (min)  45 min       Past Medical History:  Diagnosis Date  . Anal fissure   . Anxiety and depression   . Cold hands and feet   . Dyspnea   . Generalized weakness   . GERD (gastroesophageal reflux disease)    occ  . Hemorrhoids   . History of appendicitis   . History of fracture of right ankle   . History of vasculitis    Childhood  . Low back pain   . Migraines   . 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 Wears contact lenses   . Wears glasses   . Wrist fracture    Left    Past Surgical History:  Procedure Laterality Date  . COLONOSCOPY W/ POLYPECTOMY  08/2018  . FRACTURE SURGERY     Ankle fracture  . hardware removal Right    Ankle  . LAPAROSCOPIC APPENDECTOMY N/A 01/03/2017   Procedure: APPENDECTOMY LAPAROSCOPIC;  Surgeon: Armandina Gemma, MD;  Location: [facility];  Service: General;  Laterality: N/A;  . SPHINCTEROTOMY N/A 01/27/2019   Procedure: CHEMICAL SPHINCTEROTOMY BOTOX;  Surgeon: Leighton Ruff, MD;  Location: [facility];  Service: General;  Laterality: N/A;    There were no vitals filed for this visit.  Subjective Assessment - 04/06/19 1619    Subjective  Pt is in a lot of pain today.  Walking very slow and slightly forwards.  She is lying down in waiting room and immediately lies down when getting to treatment room.    Patient Stated Goals  be able to not have the horrible spasms after a BM so she can return to work soon    Currently in  Pain?  Yes    Pain Score  8     Pain Location  Rectum    Pain Orientation  Mid    Pain Descriptors / Indicators  Burning    Pain Type  Chronic pain    Pain Frequency  Constant    Multiple Pain Sites  No                       [facility] Adult PT Treatment/Exercise - 04/06/19 0001      Self-Care   Self-Care  Other Self-Care Comments    Other Self-Care Comments   coconut oil, moisturizers, desert harvest - how to apply and use externally      Modalities   Modalities  Cryotherapy      Cryotherapy   Number Minutes Cryotherapy  20 Minutes    Cryotherapy Location  Lumbar Spine    Type of Cryotherapy  Ice pack      Electrical Stimulation   Electrical Stimulation Location  sacral region    Electrical Stimulation Action  IFC    Electrical Stimulation Parameters  to tolerance (17) x 20 min    Electrical Stimulation Goals  Pain  Manual Therapy   Soft tissue mobilization  thoracic and lumbar paraspinals    Myofascial Release  thoraco lumbar fascia             PT Education - 04/06/19 1715    Education Details  gave sample of desert harvest relevium    Person(s) Educated  Patient    Methods  Explanation    Comprehension  Verbalized understanding       PT Short Term Goals - 03/31/19 1506      PT SHORT TERM GOAL #1   Title  Pt will notice muscle spasms reduced by 25% frequency    Baseline  Not getting better, has appointment to see a colorectal specialist    Status  On-going      PT SHORT TERM GOAL #2   Title  Pt will no have cramps that go all the way to her feet due to knowing how to stretch    Status  On-going        PT Long Term Goals - 03/15/19 1529      PT LONG TERM GOAL #1   Title  Pt will report regular and predictable BM so she can feel comfortable returning to work    Time  12    Period  Weeks    Status  New    Target Date  06/07/19      PT LONG TERM GOAL #2   Title  Pt will be confidnet and independent with final HEP so she can  continue to promote pelvic floor soft tissue health without increased muscle spasms    Time  12    Period  Weeks    Status  New    Target Date  06/07/19      PT LONG TERM GOAL #3   Title  Pt will report muscle spasm frequency reduced by 50% in order to be able to return to work    Time  12    Period  Weeks    Status  New    Target Date  06/07/19      PT LONG TERM GOAL #4   Title  Pt will be able to manage pain more efficiently so she can manage to work a full 8 hour shift    Baseline  currently spends her entire day on pain management and diet    Time  12    Period  Weeks    Status  New    Target Date  06/07/19      PT LONG TERM GOAL #5   Title  Pt will report 50% less pain after a BM due to improved ability to control pelvic floor muscles    Time  12    Period  Weeks    Status  New    Target Date  06/07/19            Plan - 04/06/19 1715    Clinical Impression Statement  Pt was in a lot of pain today.  Todays' session focused on pain management and she was feeling better after treatment.  Still experiencing a flare up, she had very slow gait due to small step length on the way out, but posture was more upright after treatment.  Pt will benefit from skilled PT to continue to work on strength as tolerated for improved support while healing from fissures.    Comorbidities  chronic pain, anal fissure that is not healed    PT Next Visit Plan  f/u with new stretches with breathing added to HEP; continue stretching and bulging pelvic floor, core strengthening, biofeedback?, stim and ice/heat    PT Home Exercise Plan  Access Code: [email or web address]    Consulted and Agree with Plan of Care  Patient       Patient will benefit from skilled therapeutic intervention in order to improve the following deficits and impairments:  Increased muscle spasms, Decreased range of motion, Decreased strength, Increased fascial restricitons, Pain  Visit Diagnosis: 1. Cramp and spasm   2. Chronic  left-sided low back pain without sciatica   3. Muscle weakness (generalized)        Problem List Patient Active Problem List   Diagnosis Date Noted  . Appendicitis, acute 01/03/2017  . Acute appendicitis 01/03/2017  . Paresthesia 03/01/2014  . ANKLE, PAIN 04/04/2008  . NEURALGIA 04/04/2008    Jule Ser, PT 04/06/2019, 5:23 PM   [facility] [address] [place], [place], [address] Phone: [phone]   Fax:  [phone]  Name: Shirley Webb MRN: [id number] Date of Birth: [birth year]

## 2019-04-11 ENCOUNTER — Ambulatory Visit: Payer: BC Managed Care – PPO | Admitting: Physical Therapy

## 2019-04-11 ENCOUNTER — Encounter: Payer: Self-pay | Admitting: Physical Therapy

## 2019-04-11 ENCOUNTER — Other Ambulatory Visit: Payer: Self-pay

## 2019-04-11 DIAGNOSIS — G8929 Other chronic pain: Secondary | ICD-10-CM

## 2019-04-11 DIAGNOSIS — R252 Cramp and spasm: Secondary | ICD-10-CM

## 2019-04-11 DIAGNOSIS — M545 Low back pain: Secondary | ICD-10-CM | POA: Diagnosis not present

## 2019-04-11 DIAGNOSIS — M6281 Muscle weakness (generalized): Secondary | ICD-10-CM

## 2019-04-11 NOTE — Patient Instructions (Signed)
Access Code: [email or web address]  URL: [email or web address]  Date: 04/11/2019  Prepared by: Jari Favre   Exercises  Supine Diaphragmatic Breathing - 10 reps - 1 sets - 3x daily - 7x weekly  Supine Piriformis Stretch - 3 reps - 1 sets - 30 sec hold - 1x daily - 7x weekly  Supine Hamstring Stretch with Strap - 3 reps - 1 sets - 30 sec hold - 1x daily - 7x weekly  Supine Lower Trunk Rotation - 10 reps - 1 sets - 5 sec hold - 1x daily - 7x weekly  Supine Butterfly Groin Stretch - 3 reps - 1 sets - 30 sec hold - 1x daily - 7x weekly  Supine Hip Internal and External Rotation - 10 reps - 1 sets - 5 sec hold - 1x daily - 7x weekly  Quadruped Rocking Backward - 10 reps - 1 sets - 1x daily - 7x weekly  Bent Knee Fallouts - 10 reps - 1 sets - 1x daily - 7x weekly  Supine Transversus Abdominis Bracing with Heel Slide - 10 reps - 1 sets - 1x daily - 7x weekly

## 2019-04-11 NOTE — Therapy (Signed)
[facility] [address] [place], [place], [address] Phone: [phone]   Fax:  [phone]  Physical Therapy Treatment  Patient Details  Name: Shirley Webb MRN: [id number] Date of Birth: [birth year] Referring Provider (PT): Mauri Pole, MD   Encounter Date: 04/11/2019  PT End of Session - 04/11/19 1400    Visit Number  7    Date for PT Re-Evaluation  06/07/19    PT Start Time  1400    PT Stop Time  1445    PT Time Calculation (min)  45 min    Activity Tolerance  Patient tolerated treatment well    Behavior During Therapy  [facility] for tasks assessed/performed       Past Medical History:  Diagnosis Date  . Anal fissure   . Anxiety and depression   . Cold hands and feet   . Dyspnea   . Generalized weakness   . GERD (gastroesophageal reflux disease)    occ  . Hemorrhoids   . History of appendicitis   . History of fracture of right ankle   . History of vasculitis    Childhood  . Low back pain   . Migraines   . 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 Wears contact lenses   . Wears glasses   . Wrist fracture    Left    Past Surgical History:  Procedure Laterality Date  . COLONOSCOPY W/ POLYPECTOMY  08/2018  . FRACTURE SURGERY     Ankle fracture  . hardware removal Right    Ankle  . LAPAROSCOPIC APPENDECTOMY N/A 01/03/2017   Procedure: APPENDECTOMY LAPAROSCOPIC;  Surgeon: Armandina Gemma, MD;  Location: [facility];  Service: General;  Laterality: N/A;  . SPHINCTEROTOMY N/A 01/27/2019   Procedure: CHEMICAL SPHINCTEROTOMY BOTOX;  Surgeon: Leighton Ruff, MD;  Location: [facility];  Service: General;  Laterality: N/A;    There were no vitals filed for this visit.  Subjective Assessment - 04/11/19 1402    Subjective  Pt states she was in a lot of pain since last visit.  Today is better because no BM.  Pt noticed she was taking anti spasm meds preventative.  Recently she stopped taking  those but still not having as much spasms.  Pt states the ulcer pain feels like it is worsening    Currently in Pain?  Yes    Pain Score  2     Pain Location  Rectum    Pain Orientation  Mid    Pain Descriptors / Indicators  Burning    Pain Type  Chronic pain    Aggravating Factors   BM    Multiple Pain Sites  No                       [facility] Adult PT Treatment/Exercise - 04/11/19 0001      Neuro Re-ed    Neuro Re-ed Details   breathing and gentle contracting of TrA and pelvic floor adding breathing and bulging between reps      Lumbar Exercises: Supine   Ab Set  20 reps;3 seconds    Heel Slides  20 reps    Other Supine Lumbar Exercises  bent knee drop outs - 20x    Other Supine Lumbar Exercises  alt knee bent/knee straight  with core bracing - 20x             PT Education -  04/11/19 1443    Education Details  Access Code: [email or web address]    Person(s) Educated  Patient    Methods  Explanation;Demonstration;Handout;Verbal cues    Comprehension  Verbalized understanding;Returned demonstration       PT Short Term Goals - 03/31/19 1506      PT SHORT TERM GOAL #1   Title  Pt will notice muscle spasms reduced by 25% frequency    Baseline  Not getting better, has appointment to see a colorectal specialist    Status  On-going      PT SHORT TERM GOAL #2   Title  Pt will no have cramps that go all the way to her feet due to knowing how to stretch    Status  On-going        PT Long Term Goals - 03/15/19 1529      PT LONG TERM GOAL #1   Title  Pt will report regular and predictable BM so she can feel comfortable returning to work    Time  12    Period  Weeks    Status  New    Target Date  06/07/19      PT LONG TERM GOAL #2   Title  Pt will be confidnet and independent with final HEP so she can continue to promote pelvic floor soft tissue health without increased muscle spasms    Time  12    Period  Weeks    Status  New    Target Date  06/07/19      PT  LONG TERM GOAL #3   Title  Pt will report muscle spasm frequency reduced by 50% in order to be able to return to work    Time  12    Period  Weeks    Status  New    Target Date  06/07/19      PT LONG TERM GOAL #4   Title  Pt will be able to manage pain more efficiently so she can manage to work a full 8 hour shift    Baseline  currently spends her entire day on pain management and diet    Time  12    Period  Weeks    Status  New    Target Date  06/07/19      PT LONG TERM GOAL #5   Title  Pt will report 50% less pain after a BM due to improved ability to control pelvic floor muscles    Time  12    Period  Weeks    Status  New    Target Date  06/07/19            Plan - 04/11/19 1443    Clinical Impression Statement  Pt was able to begin gentle strengthening of core and pelvic floor.  There was no increased pain during session.  Pt needed cues to breathe throughout and engage her muscles gently.  She also needed cues to go more slowly to allow time to fully relax. Pt was able to demonstrate these correctly at the end of the treatment and add some gentle strengthening into HEP.  She will continue to benefit from working towards goals as stated in [place].    PT Treatment/Interventions  ADLs/Self Care Home Management;Biofeedback;Cryotherapy;Electrical Stimulation;Moist Heat;Therapeutic activities;Therapeutic exercise;Neuromuscular re-education;Patient/family education;Manual techniques;Passive range of motion;Dry needling    PT Next Visit Plan  f/u with addition to HEP, progress core and pelvic strength    PT Home Exercise Plan  Access  Code: [email or web address]    Consulted and Agree with Plan of Care  Patient       Patient will benefit from skilled therapeutic intervention in order to improve the following deficits and impairments:  Increased muscle spasms, Decreased range of motion, Decreased strength, Increased fascial restricitons, Pain  Visit Diagnosis: 1. Cramp and spasm   2. Chronic  left-sided low back pain without sciatica   3. Muscle weakness (generalized)        Problem List Patient Active Problem List   Diagnosis Date Noted  . Appendicitis, acute 01/03/2017  . Acute appendicitis 01/03/2017  . Paresthesia 03/01/2014  . ANKLE, PAIN 04/04/2008  . NEURALGIA 04/04/2008    Jule Ser, PT 04/11/2019, 3:49 PM  [facility] [address] [place], [place], [address] Phone: [phone]   Fax:  [phone]  Name: SHEKIRA DRUMMER MRN: [id number] Date of Birth: 1980-12-09

## 2019-04-13 ENCOUNTER — Encounter: Payer: Self-pay | Admitting: Physical Therapy

## 2019-04-13 ENCOUNTER — Ambulatory Visit: Payer: BC Managed Care – PPO | Admitting: Physical Therapy

## 2019-04-13 ENCOUNTER — Other Ambulatory Visit: Payer: Self-pay

## 2019-04-13 DIAGNOSIS — R252 Cramp and spasm: Secondary | ICD-10-CM | POA: Diagnosis not present

## 2019-04-13 DIAGNOSIS — G8929 Other chronic pain: Secondary | ICD-10-CM | POA: Diagnosis not present

## 2019-04-13 DIAGNOSIS — M6281 Muscle weakness (generalized): Secondary | ICD-10-CM

## 2019-04-13 DIAGNOSIS — M545 Low back pain, unspecified: Secondary | ICD-10-CM

## 2019-04-13 NOTE — Therapy (Signed)
[facility] [address] [place], [place], [address] Phone: [phone]   Fax:  [phone]  Physical Therapy Treatment  Patient Details  Name: Shirley Webb MRN: [id number] Date of Birth: 1981-05-22 Referring Provider (PT): Mauri Pole, MD   Encounter Date: 04/13/2019  PT End of Session - 04/13/19 1458    Visit Number  8    Date for PT Re-Evaluation  06/07/19    PT Start Time  3491    PT Stop Time  1525    PT Time Calculation (min)  40 min    Activity Tolerance  Patient tolerated treatment well    Behavior During Therapy  [facility] for tasks assessed/performed       Past Medical History:  Diagnosis Date  . Anal fissure   . Anxiety and depression   . Cold hands and feet   . Dyspnea   . Generalized weakness   . GERD (gastroesophageal reflux disease)    occ  . Hemorrhoids   . History of appendicitis   . History of fracture of right ankle   . History of vasculitis    Childhood  . Low back pain   . Migraines   . 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 Wears contact lenses   . Wears glasses   . Wrist fracture    Left    Past Surgical History:  Procedure Laterality Date  . COLONOSCOPY W/ POLYPECTOMY  08/2018  . FRACTURE SURGERY     Ankle fracture  . hardware removal Right    Ankle  . LAPAROSCOPIC APPENDECTOMY N/A 01/03/2017   Procedure: APPENDECTOMY LAPAROSCOPIC;  Surgeon: Armandina Gemma, MD;  Location: [facility];  Service: General;  Laterality: N/A;  . SPHINCTEROTOMY N/A 01/27/2019   Procedure: CHEMICAL SPHINCTEROTOMY BOTOX;  Surgeon: Leighton Ruff, MD;  Location: [facility];  Service: General;  Laterality: N/A;    There were no vitals filed for this visit.  Subjective Assessment - 04/13/19 1450    Subjective  Pt was able to get through the stretches after BM yesterday.  After that became very intense and was hard to relax.  It started at 11am up to 7/10 until 10-11pm. Overall  the pain is more centralized, not down into legs or feet.    Pertinent History  had surgery for fissure, chronic pain    Currently in Pain?  Yes    Pain Score  3     Pain Descriptors / Indicators  Burning    Pain Type  Chronic pain    Pain Frequency  Constant    Aggravating Factors   BM and moving    Multiple Pain Sites  No                       [facility] Adult PT Treatment/Exercise - 04/13/19 0001      Lumbar Exercises: Prone   Other Prone Lumbar Exercises  hip IR stretch then pull back together - 10x      Manual Therapy   Soft tissue mobilization  thoracic and lumbar paraspinals    Myofascial Release  thoraco lumbar fascia       Trigger Point Dry Needling - 04/13/19 0001    Muscles Treated Back/Hip  Lumbar multifidi    Lumbar multifidi Response  Twitch response elicited;Palpable increased muscle length             PT Short Term Goals - 04/13/19  [place] #1   Title  Pt will notice muscle spasms reduced by 25% frequency    Baseline  able to see a difference with how the medicine is working    Status  On-going      PT SHORT TERM GOAL #2   Title  Pt will no have cramps that go all the way to her feet due to knowing how to stretch    Baseline  has not been having cramps that go all the way to feet    Status  Achieved        PT Long Term Goals - 03/15/19 1529      PT LONG TERM GOAL #1   Title  Pt will report regular and predictable BM so she can feel comfortable returning to work    Time  12    Period  Weeks    Status  New    Target Date  06/07/19      PT LONG TERM GOAL #2   Title  Pt will be confidnet and independent with final HEP so she can continue to promote pelvic floor soft tissue health without increased muscle spasms    Time  12    Period  Weeks    Status  New    Target Date  06/07/19      PT LONG TERM GOAL #3   Title  Pt will report muscle spasm frequency reduced by 50% in order to be able to return to work     Time  12    Period  Weeks    Status  New    Target Date  06/07/19      PT LONG TERM GOAL #4   Title  Pt will be able to manage pain more efficiently so she can manage to work a full 8 hour shift    Baseline  currently spends her entire day on pain management and diet    Time  12    Period  Weeks    Status  New    Target Date  06/07/19      PT LONG TERM GOAL #5   Title  Pt will report 50% less pain after a BM due to improved ability to control pelvic floor muscles    Time  12    Period  Weeks    Status  New    Target Date  06/07/19            Plan - 04/13/19 1533    Clinical Impression Statement  Pt was in slightly more pain today but it was staying at [place] with medicine.  She is having less pain overall and achieved one short term goal related to less cramping.  PT focused on soft tissue release to assist in easing of symptoms.  Pt was able to to one exercise without increased pain.  Pt will continue to benefit form skilled PT to work on core and pelvic strength and coordination.    PT Treatment/Interventions  ADLs/Self Care Home Management;Biofeedback;Cryotherapy;Electrical Stimulation;Moist Heat;Therapeutic activities;Therapeutic exercise;Neuromuscular re-education;Patient/family education;Manual techniques;Passive range of motion;Dry needling    PT Next Visit Plan  f/u with addition to HEP, progress core and pelvic strength    PT Home Exercise Plan  Access Code: [email or web address]    Consulted and Agree with Plan of Care  Patient       Patient will benefit from skilled therapeutic intervention in order to improve the following deficits  and impairments:  Increased muscle spasms, Decreased range of motion, Decreased strength, Increased fascial restricitons, Pain  Visit Diagnosis: 1. Cramp and spasm   2. Chronic left-sided low back pain without sciatica   3. Muscle weakness (generalized)        Problem List Patient Active Problem List   Diagnosis Date Noted  . Appendicitis,  acute 01/03/2017  . Acute appendicitis 01/03/2017  . Paresthesia 03/01/2014  . ANKLE, PAIN 04/04/2008  . NEURALGIA 04/04/2008    Jule Ser, PT 04/13/2019, 3:38 PM  [facility] [address] [place], [place], [address] Phone: [phone]   Fax:  [phone]  Name: Shirley Webb MRN: [id number] Date of Birth: 1981/01/30

## 2019-04-17 ENCOUNTER — Ambulatory Visit: Payer: BC Managed Care – PPO | Admitting: Physical Therapy

## 2019-04-17 ENCOUNTER — Other Ambulatory Visit: Payer: Self-pay

## 2019-04-17 DIAGNOSIS — G8929 Other chronic pain: Secondary | ICD-10-CM

## 2019-04-17 DIAGNOSIS — M545 Low back pain: Secondary | ICD-10-CM | POA: Diagnosis not present

## 2019-04-17 DIAGNOSIS — M6281 Muscle weakness (generalized): Secondary | ICD-10-CM | POA: Diagnosis not present

## 2019-04-17 DIAGNOSIS — R252 Cramp and spasm: Secondary | ICD-10-CM | POA: Diagnosis not present

## 2019-04-17 NOTE — Therapy (Signed)
[facility] [address], [place], [place], [address] Phone: [phone]   Fax:  [phone]  Physical Therapy Treatment  Patient Details  Name: Shirley Webb MRN: [id number] Date of Birth: March 28, 1981 Referring Provider (PT): Mauri Pole, MD   Encounter Date: 04/17/2019  PT End of Session - 04/17/19 1155    Visit Number  9    Date for PT Re-Evaluation  06/07/19    PT Start Time  3149    PT Stop Time  1229    PT Time Calculation (min)  44 min    Activity Tolerance  Patient tolerated treatment Webb    Behavior During Therapy  [facility] for tasks assessed/performed       Past Medical History:  Diagnosis Date  . Anal fissure   . Anxiety and depression   . Cold hands and feet   . Dyspnea   . Generalized weakness   . GERD (gastroesophageal reflux disease)    occ  . Hemorrhoids   . History of appendicitis   . History of fracture of right ankle   . History of vasculitis    Childhood  . Low back pain   . Migraines   . 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 Wears contact lenses   . Wears glasses   . Wrist fracture    Left    Past Surgical History:  Procedure Laterality Date  . COLONOSCOPY W/ POLYPECTOMY  08/2018  . FRACTURE SURGERY     Ankle fracture  . hardware removal Right    Ankle  . LAPAROSCOPIC APPENDECTOMY N/A 01/03/2017   Procedure: APPENDECTOMY LAPAROSCOPIC;  Surgeon: Armandina Gemma, MD;  Location: [facility];  Service: General;  Laterality: N/A;  . SPHINCTEROTOMY N/A 01/27/2019   Procedure: CHEMICAL SPHINCTEROTOMY BOTOX;  Surgeon: Leighton Ruff, MD;  Location: [facility];  Service: General;  Laterality: N/A;    There were no vitals filed for this visit.  Subjective Assessment - 04/17/19 1152    Subjective  I felt a lot better after the previous treatment.  States things loosed up.  Overall, the pain wasn't as excruciating as the last week. Pt denies pain currently just a  little discomfort.    Patient Stated Goals  be able to not have the horrible spasms after a BM so she can return to work soon    Currently in Pain?  No/denies                       [facility] Adult PT Treatment/Exercise - 04/17/19 0001      Lumbar Exercises: Stretches   Other Lumbar Stretch Exercise  child's pose      Lumbar Exercises: Standing   Other Standing Lumbar Exercises  hip abduction with cues to engage core      Lumbar Exercises: Quadruped   Other Quadruped Lumbar Exercises  rocking fwd/back; modified plank - 2 x 30 sec each      Manual Therapy   Soft tissue mobilization  thoracic and lumbar paraspinals, bilateral gluteals and piriformis               PT Short Term Goals - 04/13/19 1536      PT SHORT TERM GOAL #1   Title  Pt will notice muscle spasms reduced by 25% frequency    Baseline  able to see a difference with how the medicine is working    Status  On-going  PT SHORT TERM GOAL #2   Title  Pt will no have cramps that go all the way to her feet due to knowing how to stretch    Baseline  has not been having cramps that go all the way to feet    Status  Achieved        PT Long Term Goals - 03/15/19 1529      PT LONG TERM GOAL #1   Title  Pt will report regular and predictable BM so she can feel comfortable returning to work    Time  12    Period  Weeks    Status  New    [place] Date  06/07/19      PT LONG TERM GOAL #2   Title  Pt will be confidnet and independent with final HEP so she can continue to promote pelvic floor soft tissue health without increased muscle spasms    Time  12    Period  Weeks    Status  New    Target Date  06/07/19      PT LONG TERM GOAL #3   Title  Pt will report muscle spasm frequency reduced by 50% in order to be able to return to work    Time  12    Period  Weeks    Status  New    Target Date  06/07/19      PT LONG TERM GOAL #4   Title  Pt will be able to manage pain more efficiently so she can  manage to work a full 8 hour shift    Baseline  currently spends her entire day on pain management and diet    Time  12    Period  Weeks    Status  New    Target Date  06/07/19      PT LONG TERM GOAL #5   Title  Pt will report 50% less pain after a BM due to improved ability to control pelvic floor muscles    Time  12    Period  Weeks    Status  New    Target Date  06/07/19            Plan - 04/17/19 1229    Clinical Impression Statement  Pt was feeling better today than shd did last week.  Pt responded [facility] from [facility] last week.  She continues to have some tension in the gluteals and piriformis Lt>Rt today.  Pt responded Webb from [facility] again today.  She was able to porgress difficulty of core strengthening today.  she will continue to benefit from skilled PT to progress strength and help in management of pain.    PT Treatment/Interventions  ADLs/Self Care Home Management;Biofeedback;Cryotherapy;Electrical Stimulation;Moist Heat;Therapeutic activities;Therapeutic exercise;Neuromuscular re-education;Patient/family education;Manual techniques;Passive range of motion;Dry needling    PT Next Visit Plan  f/u with addition to HEP, progress core and pelvic strength, DN    PT Home Exercise Plan  Access Code: [email or web address]    Consulted and Agree with Plan of Care  Patient       Patient will benefit from skilled therapeutic intervention in order to improve the following deficits and impairments:  Increased muscle spasms, Decreased range of motion, Decreased strength, Increased fascial restricitons, Pain  Visit Diagnosis: 1. Cramp and spasm   2. Chronic left-sided low back pain without sciatica   3. Muscle weakness (generalized)        Problem List Patient Active Problem List  Diagnosis Date Noted  . Appendicitis, acute 01/03/2017  . Acute appendicitis 01/03/2017  . Paresthesia 03/01/2014  . ANKLE, PAIN 04/04/2008  . NEURALGIA 04/04/2008    Jule Ser, PT 04/17/2019, 1:45  PM  [facility] [address], [place], [place], [address] Phone: [phone]   Fax:  [phone]  Name: Shirley Webb MRN: [id number] Date of Birth: 09-05-1980

## 2019-04-19 ENCOUNTER — Other Ambulatory Visit: Payer: Self-pay

## 2019-04-19 ENCOUNTER — Encounter: Payer: Self-pay | Admitting: Physical Therapy

## 2019-04-19 ENCOUNTER — Ambulatory Visit: Payer: BC Managed Care – PPO | Admitting: Physical Therapy

## 2019-04-19 DIAGNOSIS — M6281 Muscle weakness (generalized): Secondary | ICD-10-CM | POA: Diagnosis not present

## 2019-04-19 DIAGNOSIS — R252 Cramp and spasm: Secondary | ICD-10-CM

## 2019-04-19 DIAGNOSIS — M545 Low back pain: Secondary | ICD-10-CM | POA: Diagnosis not present

## 2019-04-19 DIAGNOSIS — G8929 Other chronic pain: Secondary | ICD-10-CM

## 2019-04-19 NOTE — Therapy (Signed)
[facility] [address] [place], [place], [address] Phone: [phone]   Fax:  [phone]  Physical Therapy Treatment  Patient Details  Name: Shirley Webb MRN: [id number] Date of Birth: [birth year] Referring Provider (PT): Mauri Pole, MD   Encounter Date: 04/19/2019  PT End of Session - 04/19/19 1405    Visit Number  10    Date for PT Re-Evaluation  06/07/19    PT Start Time  0263    PT Stop Time  1444    PT Time Calculation (min)  39 min    Activity Tolerance  Patient tolerated treatment well       Past Medical History:  Diagnosis Date  . Anal fissure   . Anxiety and depression   . Cold hands and feet   . Dyspnea   . Generalized weakness   . GERD (gastroesophageal reflux disease)    occ  . Hemorrhoids   . History of appendicitis   . History of fracture of right ankle   . History of vasculitis    Childhood  . Low back pain   . Migraines   . 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 Wears contact lenses   . Wears glasses   . Wrist fracture    Left    Past Surgical History:  Procedure Laterality Date  . COLONOSCOPY W/ POLYPECTOMY  08/2018  . FRACTURE SURGERY     Ankle fracture  . hardware removal Right    Ankle  . LAPAROSCOPIC APPENDECTOMY N/A 01/03/2017   Procedure: APPENDECTOMY LAPAROSCOPIC;  Surgeon: Armandina Gemma, MD;  Location: [facility];  Service: General;  Laterality: N/A;  . SPHINCTEROTOMY N/A 01/27/2019   Procedure: CHEMICAL SPHINCTEROTOMY BOTOX;  Surgeon: Leighton Ruff, MD;  Location: [facility];  Service: General;  Laterality: N/A;    There were no vitals filed for this visit.  Subjective Assessment - 04/19/19 1406    Subjective  Yesterday and today have been pretty good.  I had a lot of bleeding when I went to the bathroom yesterday.  Pt is able to sit almost upright in chair today.    Patient Stated Goals  be able to not have the horrible spasms after a  BM so she can return to work soon    Currently in Pain?  No/denies                       [facility] Adult PT Treatment/Exercise - 04/19/19 0001      Lumbar Exercises: Quadruped   Single Arm Raise  Right;Left;10 reps    Other Quadruped Lumbar Exercises  on forearms rocking fwd/back; modified plank - 2 x 30 sec each    Other Quadruped Lumbar Exercises  child's pose - 3 x 20 sec      Manual Therapy   Soft tissue mobilization  lumbar paraspinals and Lt gluteal               PT Short Term Goals - 04/13/19 1536      PT SHORT TERM GOAL #1   Title  Pt will notice muscle spasms reduced by 25% frequency    Baseline  able to see a difference with how the medicine is working    Status  On-going      PT SHORT TERM GOAL #2   Title  Pt will no have cramps that go all the way to her feet due  to knowing how to stretch    Baseline  has not been having cramps that go all the way to feet    Status  Achieved        PT Long Term Goals - 03/15/19 1529      PT LONG TERM GOAL #1   Title  Pt will report regular and predictable BM so she can feel comfortable returning to work    Time  12    Period  Weeks    Status  New    Target Date  06/07/19      PT LONG TERM GOAL #2   Title  Pt will be confidnet and independent with final HEP so she can continue to promote pelvic floor soft tissue health without increased muscle spasms    Time  12    Period  Weeks    Status  New    Target Date  06/07/19      PT LONG TERM GOAL #3   Title  Pt will report muscle spasm frequency reduced by 50% in order to be able to return to work    Time  12    Period  Weeks    Status  New    Target Date  06/07/19      PT LONG TERM GOAL #4   Title  Pt will be able to manage pain more efficiently so she can manage to work a full 8 hour shift    Baseline  currently spends her entire day on pain management and diet    Time  12    Period  Weeks    Status  New    Target Date  06/07/19      PT LONG  TERM GOAL #5   Title  Pt will report 50% less pain after a BM due to improved ability to control pelvic floor muscles    Time  12    Period  Weeks    Status  New    Target Date  06/07/19            Plan - 04/19/19 1708    Clinical Impression Statement  Pt is having another good day today with no pain and able to sit upright in her chair.  Pt was able to progress with strengthening exercises quadruped on forearms and rocking forward and back as well as arm reaching in quadruped.  Pt will continue to benefit from skilled PT to progress strength and manage pain and muscle spasms.    PT Treatment/Interventions  ADLs/Self Care Home Management;Biofeedback;Cryotherapy;Electrical Stimulation;Moist Heat;Therapeutic activities;Therapeutic exercise;Neuromuscular re-education;Patient/family education;Manual techniques;Passive range of motion;Dry needling    PT Next Visit Plan  f/u with addition to HEP, progress core and pelvic strength, DN    PT Home Exercise Plan  Access Code: [email or web address]    Consulted and Agree with Plan of Care  Patient       Patient will benefit from skilled therapeutic intervention in order to improve the following deficits and impairments:  Increased muscle spasms, Decreased range of motion, Decreased strength, Increased fascial restricitons, Pain  Visit Diagnosis: 1. Cramp and spasm   2. Chronic left-sided low back pain without sciatica   3. Muscle weakness (generalized)        Problem List Patient Active Problem List   Diagnosis Date Noted  . Appendicitis, acute 01/03/2017  . Acute appendicitis 01/03/2017  . Paresthesia 03/01/2014  . ANKLE, PAIN 04/04/2008  . NEURALGIA 04/04/2008    Jule Ser, PT  04/19/2019, 5:17 PM  [facility] [address], [place], [place], [address] Phone: [phone]   Fax:  [phone]  Name: Shirley Webb MRN: [id number] Date of Birth: 30-Aug-1981

## 2019-04-24 ENCOUNTER — Encounter: Payer: Self-pay | Admitting: Physical Therapy

## 2019-04-24 ENCOUNTER — Ambulatory Visit: Payer: BC Managed Care – PPO | Admitting: Physical Therapy

## 2019-04-24 ENCOUNTER — Other Ambulatory Visit: Payer: Self-pay

## 2019-04-24 DIAGNOSIS — M6281 Muscle weakness (generalized): Secondary | ICD-10-CM | POA: Diagnosis not present

## 2019-04-24 DIAGNOSIS — M545 Low back pain: Secondary | ICD-10-CM | POA: Diagnosis not present

## 2019-04-24 DIAGNOSIS — G8929 Other chronic pain: Secondary | ICD-10-CM

## 2019-04-24 DIAGNOSIS — R252 Cramp and spasm: Secondary | ICD-10-CM

## 2019-04-24 NOTE — Therapy (Signed)
[facility] [address] [place], [place], [address] Phone: [phone]   Fax:  [phone]  Physical Therapy Treatment  Patient Details  Name: Shirley Webb MRN: [id number] Date of Birth: March 22, 1981 Referring Provider (PT): Mauri Pole, MD   Encounter Date: 04/24/2019  PT End of Session - 04/24/19 1446    Visit Number  11    Date for PT Re-Evaluation  06/07/19    PT Start Time  1446    PT Stop Time  1525    PT Time Calculation (min)  39 min    Activity Tolerance  Patient tolerated treatment well    Behavior During Therapy  [facility] for tasks assessed/performed       Past Medical History:  Diagnosis Date  . Anal fissure   . Anxiety and depression   . Cold hands and feet   . Dyspnea   . Generalized weakness   . GERD (gastroesophageal reflux disease)    occ  . Hemorrhoids   . History of appendicitis   . History of fracture of right ankle   . History of vasculitis    Childhood  . Low back pain   . Migraines   . 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 Wears contact lenses   . Wears glasses   . Wrist fracture    Left    Past Surgical History:  Procedure Laterality Date  . COLONOSCOPY W/ POLYPECTOMY  08/2018  . FRACTURE SURGERY     Ankle fracture  . hardware removal Right    Ankle  . LAPAROSCOPIC APPENDECTOMY N/A 01/03/2017   Procedure: APPENDECTOMY LAPAROSCOPIC;  Surgeon: Armandina Gemma, MD;  Location: [facility];  Service: General;  Laterality: N/A;  . SPHINCTEROTOMY N/A 01/27/2019   Procedure: CHEMICAL SPHINCTEROTOMY BOTOX;  Surgeon: Leighton Ruff, MD;  Location: [facility];  Service: General;  Laterality: N/A;    There were no vitals filed for this visit.  Subjective Assessment - 04/24/19 1448    Subjective  I was in a lot of pain this weekend because I had BMs early on.  It also felt a lot more painful coming out.  Pt states she is more on edge and worn out today.  Pt states  she would not say she is in pain.    Pertinent History  had surgery for fissure, chronic pain    Currently in Pain?  No/denies                       [facility] Adult PT Treatment/Exercise - 04/24/19 0001      Manual Therapy   Soft tissue mobilization  lumbar paraspinals bilat, and Lt gluteal, hamstrings bilat       Trigger Point Dry Needling - 04/24/19 0001    Consent Given?  Yes    Lumbar multifidi Response  Twitch response elicited;Palpable increased muscle length             PT Short Term Goals - 04/13/19 1536      PT SHORT TERM GOAL #1   Title  Pt will notice muscle spasms reduced by 25% frequency    Baseline  able to see a difference with how the medicine is working    Status  On-going      PT SHORT TERM GOAL #2   Title  Pt will no have cramps that go all the way to her feet due to knowing how  to stretch    Baseline  has not been having cramps that go all the way to feet    Status  Achieved        PT Long Term Goals - 04/24/19 1531      PT LONG TERM GOAL #1   Title  Pt will report regular and predictable BM so she can feel comfortable returning to work    Status  On-going      PT LONG TERM GOAL #2   Title  Pt will be confidnet and independent with final HEP so she can continue to promote pelvic floor soft tissue health without increased muscle spasms    Status  On-going      PT LONG TERM GOAL #3   Title  Pt will report muscle spasm frequency reduced by 50% in order to be able to return to work    Status  On-going      PT LONG TERM GOAL #4   Title  Pt will be able to manage pain more efficiently so she can manage to work a full 8 hour shift    Status  On-going      PT LONG TERM GOAL #5   Title  Pt will report 50% less pain after a BM due to improved ability to control pelvic floor muscles    Status  On-going            Plan - 04/24/19 1529    Clinical Impression Statement  Today's session focused more on relaxation.  Tension found  mostly bilat lumbar multifidi Lt>Rt, and Lt hamstrings and glutes.  Pt was very tense due to the last two days being very painful.  Pt will benefit from skilled PT to continue to progress strength and relaxation techniques as able.    PT Treatment/Interventions  ADLs/Self Care Home Management;Biofeedback;Cryotherapy;Electrical Stimulation;Moist Heat;Therapeutic activities;Therapeutic exercise;Neuromuscular re-education;Patient/family education;Manual techniques;Passive range of motion;Dry needling    PT Next Visit Plan  f/u with additions to HEP, progress core and pelvic strength, DN    PT Home Exercise Plan  Access Code: [email or web address]    Consulted and Agree with Plan of Care  Patient       Patient will benefit from skilled therapeutic intervention in order to improve the following deficits and impairments:  Increased muscle spasms, Decreased range of motion, Decreased strength, Increased fascial restricitons, Pain  Visit Diagnosis: Cramp and spasm  Chronic left-sided low back pain without sciatica  Muscle weakness (generalized)     Problem List Patient Active Problem List   Diagnosis Date Noted  . Appendicitis, acute 01/03/2017  . Acute appendicitis 01/03/2017  . Paresthesia 03/01/2014  . ANKLE, PAIN 04/04/2008  . NEURALGIA 04/04/2008    Jule Ser, PT 04/24/2019, 3:32 PM  [facility] [address] [place], [place], [address] Phone: [phone]   Fax:  [phone]  Name: Shirley Webb MRN: [id number] Date of Birth: 1980-10-30

## 2019-04-25 IMAGING — CT CT ABD-PELV W/ CM
2 of 4 series · 16 of 46 positions shown, 18 images · IV contrast (iopamidol)
Comparison: None.

CLINICAL DATA: Lower abdominal pain and tenderness.

EXAM:
CT ABDOMEN AND PELVIS WITH CONTRAST
TECHNIQUE: Multidetector CT imaging of the abdomen and pelvis was performed
using the standard protocol following bolus administration of
intravenous contrast.
CONTRAST:  100mL [id number] IOPAMIDOL ([id number]) INJECTION 61%

[Series 2: abd/pel with · axial · 0.69mm/px · z∈[-395,-70]mm · 13 of 75 slices shown, 15 images]
[im 5/75  soft-tissue]
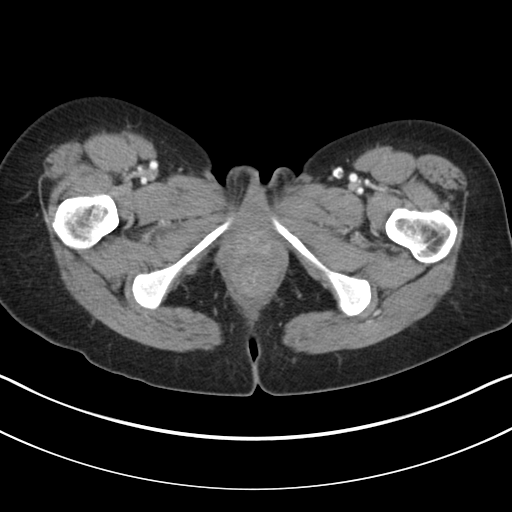
[im 5/75  bone]
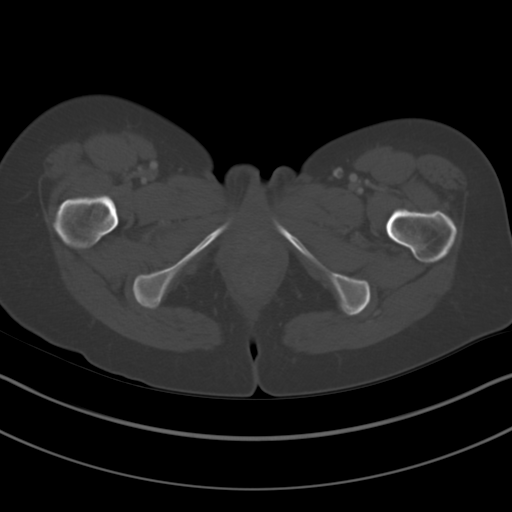
[im 9/75  soft-tissue]
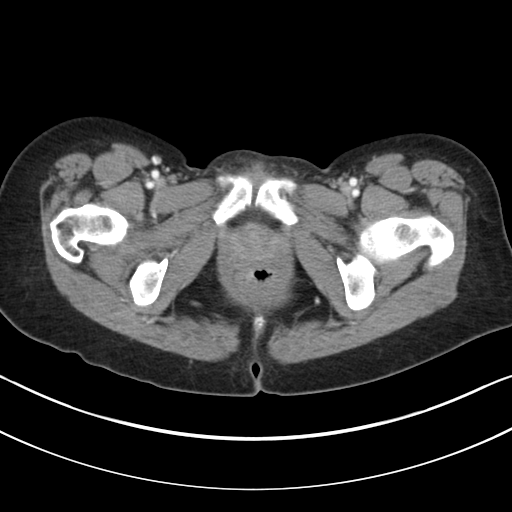
[im 18/75  soft-tissue]
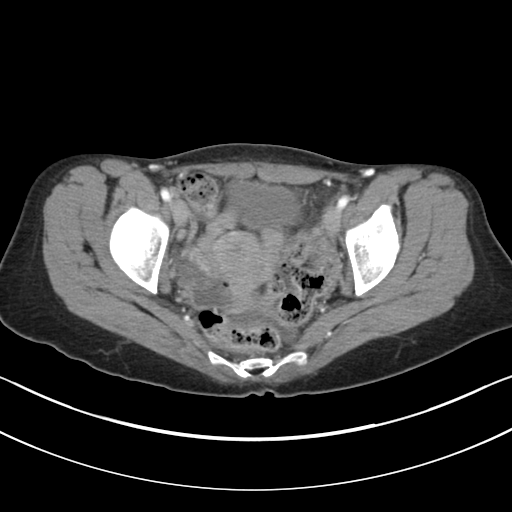
[im 22/75  soft-tissue]
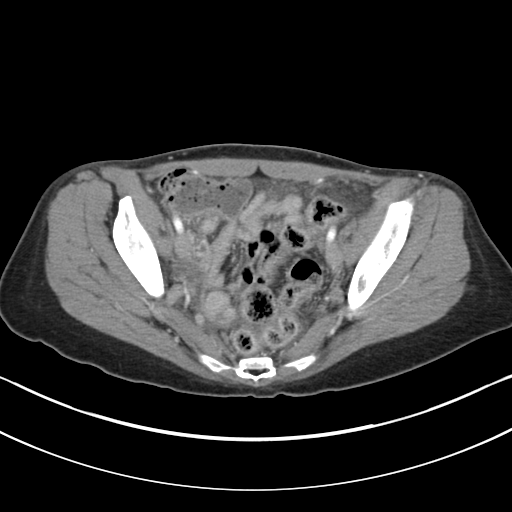
[im 27/75  soft-tissue]
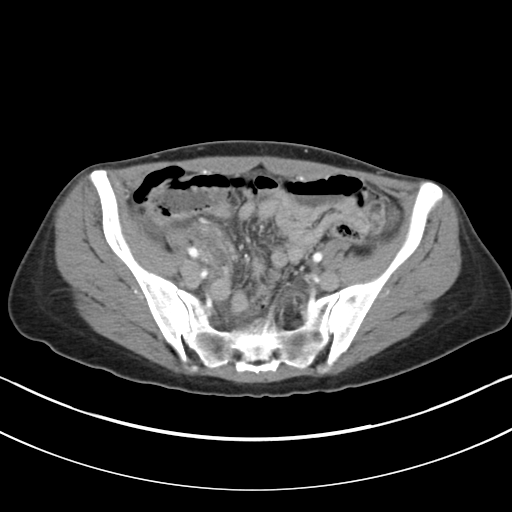
[im 31/75  soft-tissue]
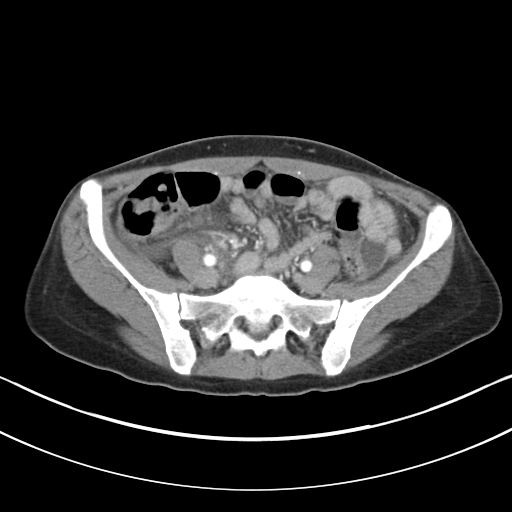
[im 40/75  soft-tissue]
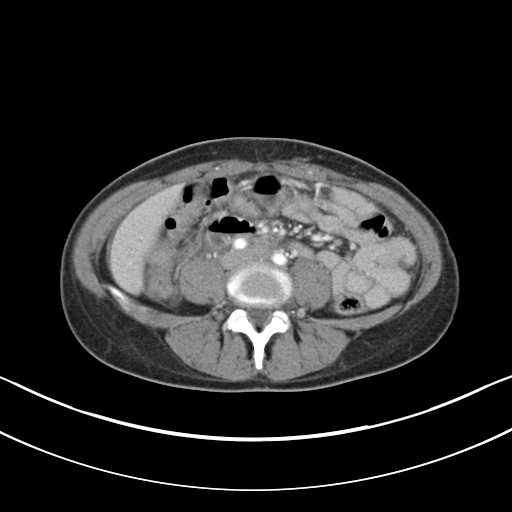
[im 44/75  soft-tissue]
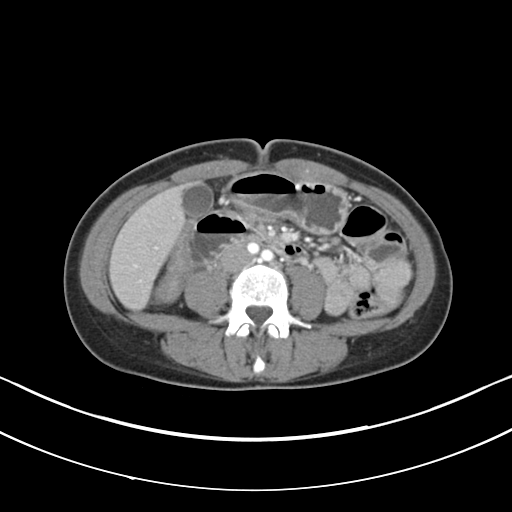
[im 48/75  soft-tissue]
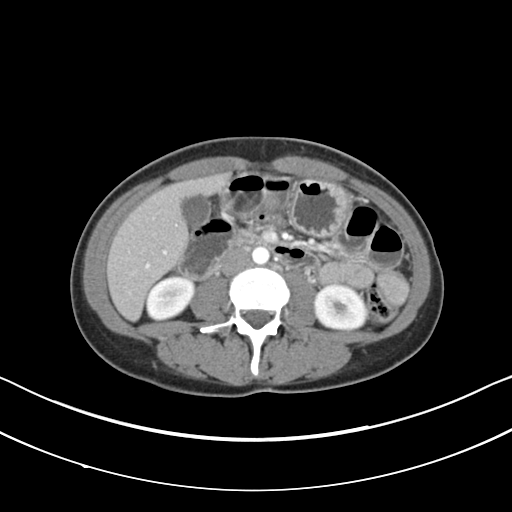
[im 48/75  bone]
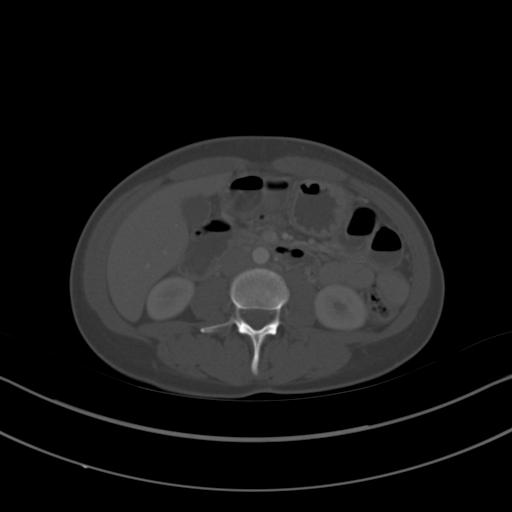
[im 53/75  soft-tissue]
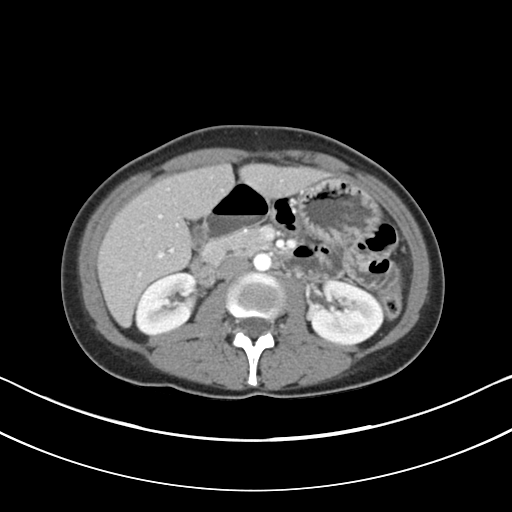
[im 57/75  soft-tissue]
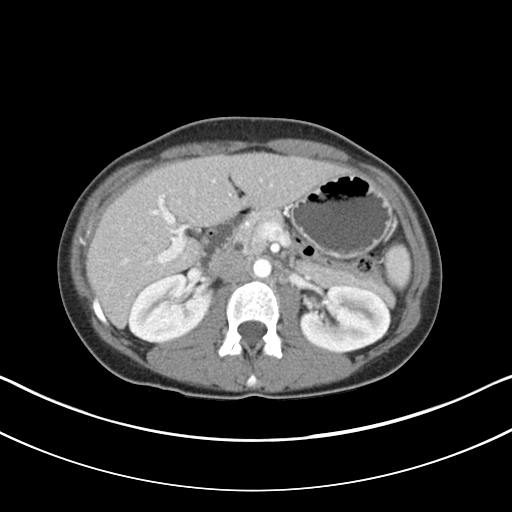
[im 66/75  soft-tissue]
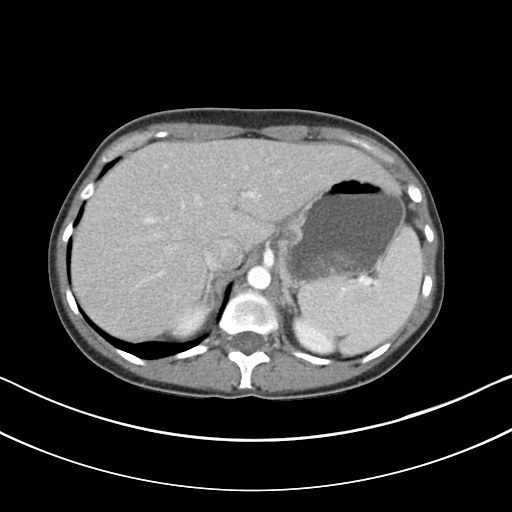
[im 70/75  soft-tissue]
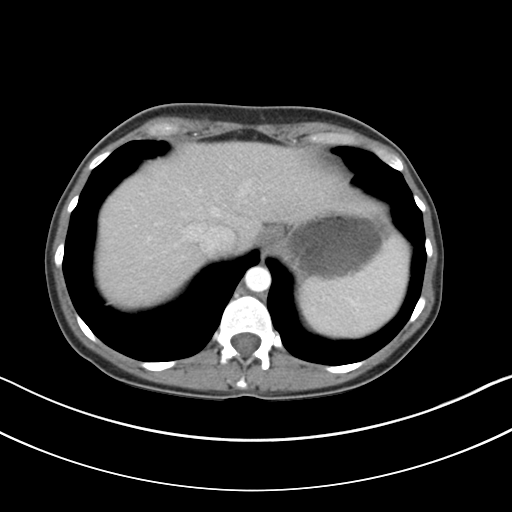

[Series 5: coronal a/|p · coronal · 0.67mm/px · 3 of 101 slices shown]
[im 34/101  soft-tissue]
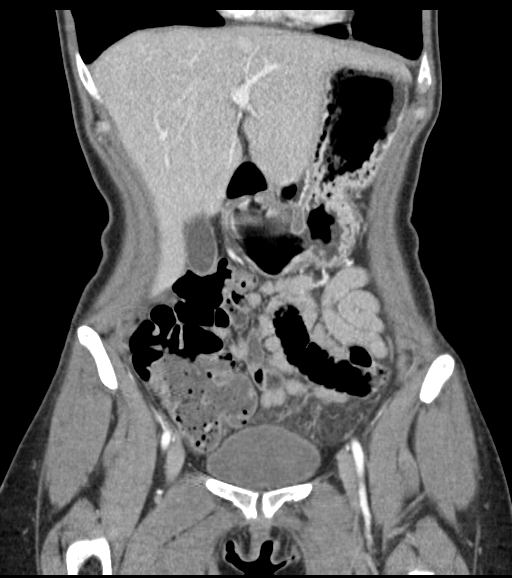
[im 45/101  soft-tissue]
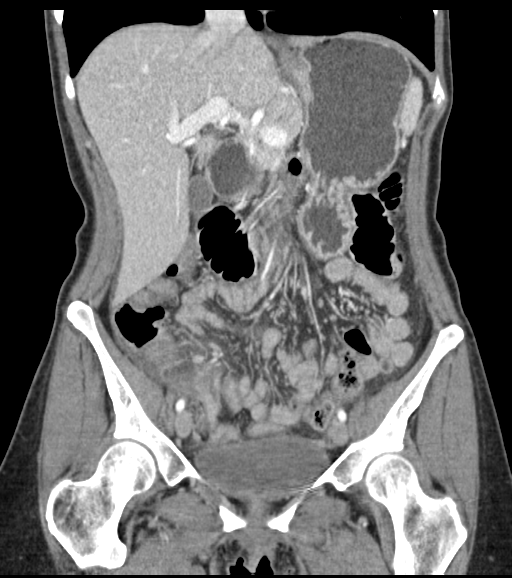
[im 56/101  soft-tissue]
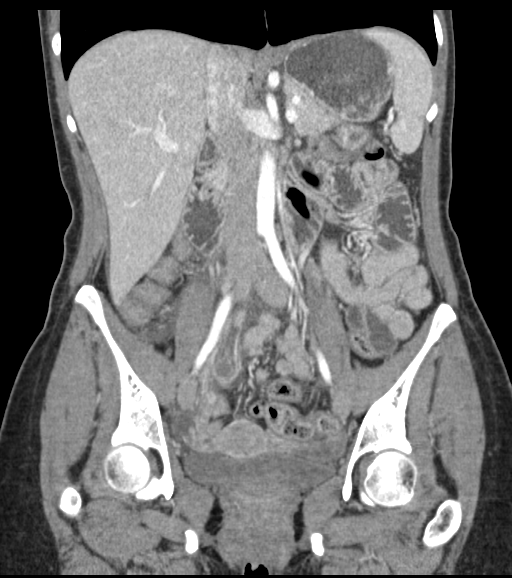

[16 of 46 positions shown; findings below may reference images not displayed]

FINDINGS: Lower chest: No acute abnormality.

Hepatobiliary: No focal liver abnormality is seen. No gallstones,
gallbladder wall thickening, or biliary dilatation.

Pancreas: Unremarkable. No pancreatic ductal dilatation or
surrounding inflammatory changes.

Spleen: Normal in size without focal abnormality.

Adrenals/Urinary Tract: Adrenal glands are unremarkable. Kidneys are
normal, without renal calculi, focal lesion, or hydronephrosis.
Bladder is unremarkable.

Stomach/Bowel: There is evidence of acute appendicitis with a
dilated and inflamed appendix present in the right pelvis emanating
from the cecum and coursing posteriorly in the pelvis. The appendix
contains 2 focal areas of internal partially calcified fecaliths.
There is some associated small surrounding free fluid without
evidence of focal abscess or visible extraluminal air. The appendix
measures approximately 13 mm in greatest caliber. The wall
demonstrates hyperemia. Other bowel loops in the abdomen and pelvis
are unremarkable.

Vascular/Lymphatic: No enlarged lymph nodes identified. No vascular
abnormalities identified.

Reproductive: Uterus and bilateral adnexa are unremarkable.

Other: No hernias are seen.

Musculoskeletal: No acute or significant osseous findings.
IMPRESSION: Evidence of acute appendicitis with dilated an inflamed appendix
measuring approximately 13 mm in greatest caliber and containing 2
focal areas of partially calcified fecaliths. There is no evidence
of overt appendiceal perforation or appendiceal abscess.

## 2019-04-27 ENCOUNTER — Other Ambulatory Visit: Payer: Self-pay

## 2019-04-27 ENCOUNTER — Ambulatory Visit: Payer: BC Managed Care – PPO | Admitting: Physical Therapy

## 2019-04-27 ENCOUNTER — Encounter: Payer: Self-pay | Admitting: Physical Therapy

## 2019-04-27 DIAGNOSIS — R252 Cramp and spasm: Secondary | ICD-10-CM

## 2019-04-27 DIAGNOSIS — M545 Low back pain, unspecified: Secondary | ICD-10-CM

## 2019-04-27 DIAGNOSIS — G8929 Other chronic pain: Secondary | ICD-10-CM

## 2019-04-27 DIAGNOSIS — M6281 Muscle weakness (generalized): Secondary | ICD-10-CM

## 2019-04-27 NOTE — Therapy (Signed)
[facility] [address] [place], [place], [address] Phone: [phone]   Fax:  [phone]  Physical Therapy Treatment  Patient Details  Name: Shirley Webb MRN: [id number] Date of Birth: Jul 02, 1981 Referring Provider (PT): Mauri Pole, MD   Encounter Date: 04/27/2019  PT End of Session - 04/27/19 1723    Visit Number  12    Date for PT Re-Evaluation  06/07/19    PT Start Time  1539   arrived late   PT Stop Time  1615    PT Time Calculation (min)  36 min    Activity Tolerance  Patient tolerated treatment well    Behavior During Therapy  [facility] for tasks assessed/performed       Past Medical History:  Diagnosis Date  . Anal fissure   . Anxiety and depression   . Cold hands and feet   . Dyspnea   . Generalized weakness   . GERD (gastroesophageal reflux disease)    occ  . Hemorrhoids   . History of appendicitis   . History of fracture of right ankle   . History of vasculitis    Childhood  . Low back pain   . Migraines   . 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 Wears contact lenses   . Wears glasses   . Wrist fracture    Left    Past Surgical History:  Procedure Laterality Date  . COLONOSCOPY W/ POLYPECTOMY  08/2018  . FRACTURE SURGERY     Ankle fracture  . hardware removal Right    Ankle  . LAPAROSCOPIC APPENDECTOMY N/A 01/03/2017   Procedure: APPENDECTOMY LAPAROSCOPIC;  Surgeon: Armandina Gemma, MD;  Location: [facility];  Service: General;  Laterality: N/A;  . SPHINCTEROTOMY N/A 01/27/2019   Procedure: CHEMICAL SPHINCTEROTOMY BOTOX;  Surgeon: Leighton Ruff, MD;  Location: [facility];  Service: General;  Laterality: N/A;    There were no vitals filed for this visit.  Subjective Assessment - 04/27/19 1548    Subjective  I have done a little walking almost every day.  My hamstring is more tight and having some more pain today but not horrible.    Pertinent History  had  surgery for fissure, chronic pain    Currently in Pain?  Yes    Pain Score  3    sharp stabs when stepping up to 6/10   Pain Location  Rectum                       [facility] Adult PT Treatment/Exercise - 04/27/19 0001      Lumbar Exercises: Aerobic   UBE (Upper Arm Bike)  L1 in standing 2x2 fwd/back      Lumbar Exercises: Standing   Other Standing Lumbar Exercises  forward arm reaches at wall with core engaged      Lumbar Exercises: Quadruped   Other Quadruped Lumbar Exercises  modified forearms - hip ext - attempt x 3 but felt it was starting to cause increased pain       Trigger Point Dry Needling - 04/27/19 0001    Consent Given?  Yes    Education Handout Provided  Previously provided    Muscles Treated Back/Hip  Gluteus minimus;Gluteus medius;Gluteus maximus;Piriformis;Tensor fascia lata   bilateral   Gluteus Minimus Response  Twitch response elicited;Palpable increased muscle length    Gluteus Medius Response  Twitch response elicited;Palpable increased muscle length  Gluteus Maximus Response  Twitch response elicited;Palpable increased muscle length    Piriformis Response  Twitch response elicited;Palpable increased muscle length    Tensor Fascia Lata Response  Twitch response elicited;Palpable increased muscle length             PT Short Term Goals - 04/27/19 1724      PT SHORT TERM GOAL #1   Title  Pt will notice muscle spasms reduced by 25% frequency    Baseline  able to see a difference with how the medicine is working, improved    Status  Partially Met      PT SHORT TERM GOAL #2   Title  Pt will no have cramps that go all the way to her feet due to knowing how to stretch    Status  Achieved        PT Long Term Goals - 04/24/19 1531      PT LONG TERM GOAL #1   Title  Pt will report regular and predictable BM so she can feel comfortable returning to work    Status  On-going      PT [place] #2   Title  Pt will be confidnet and  independent with final HEP so she can continue to promote pelvic floor soft tissue health without increased muscle spasms    Status  On-going      PT LONG TERM GOAL #3   Title  Pt will report muscle spasm frequency reduced by 50% in order to be able to return to work    Status  On-going      PT LONG TERM GOAL #4   Title  Pt will be able to manage pain more efficiently so she can manage to work a full 8 hour shift    Status  On-going      PT LONG TERM GOAL #5   Title  Pt will report 50% less pain after a BM due to improved ability to control pelvic floor muscles    Status  On-going            Plan - 04/27/19 1728    Clinical Impression Statement  Pt did well today and was able to do some strengthening even though she came in with a little pain.  Pt responded well to manual treatment with dry needling.  Overall she is having more good days and has been able to walk every day.  She will benefit from skilled PT to progress strength as tolerated for return to greater function and facilitate healing.    PT Treatment/Interventions  ADLs/Self Care Home Management;Biofeedback;Cryotherapy;Electrical Stimulation;Moist Heat;Therapeutic activities;Therapeutic exercise;Neuromuscular re-education;Patient/family education;Manual techniques;Passive range of motion;Dry needling    PT Next Visit Plan  progress core and pelvic strength, DN    PT Home Exercise Plan  Access Code: [email or web address]    Consulted and Agree with Plan of Care  Patient       Patient will benefit from skilled therapeutic intervention in order to improve the following deficits and impairments:  Increased muscle spasms, Decreased range of motion, Decreased strength, Increased fascial restricitons, Pain  Visit Diagnosis: Cramp and spasm  Chronic left-sided low back pain without sciatica  Muscle weakness (generalized)     Problem List Patient Active Problem List   Diagnosis Date Noted  . Appendicitis, acute 01/03/2017  . Acute  appendicitis 01/03/2017  . Paresthesia 03/01/2014  . ANKLE, PAIN 04/04/2008  . NEURALGIA 04/04/2008    Jule Ser, PT 04/27/2019, 5:32 PM  [facility] [address] [place], [place], [address] Phone: [phone]   Fax:  [phone]  Name: Shirley Webb MRN: [id number] Date of Birth: 1980-09-30

## 2019-05-01 ENCOUNTER — Other Ambulatory Visit: Payer: Self-pay

## 2019-05-01 ENCOUNTER — Encounter: Payer: Self-pay | Admitting: Physical Therapy

## 2019-05-01 ENCOUNTER — Ambulatory Visit: Payer: BC Managed Care – PPO | Admitting: Physical Therapy

## 2019-05-01 DIAGNOSIS — M545 Low back pain, unspecified: Secondary | ICD-10-CM

## 2019-05-01 DIAGNOSIS — M6281 Muscle weakness (generalized): Secondary | ICD-10-CM

## 2019-05-01 DIAGNOSIS — R252 Cramp and spasm: Secondary | ICD-10-CM

## 2019-05-01 DIAGNOSIS — G8929 Other chronic pain: Secondary | ICD-10-CM | POA: Diagnosis not present

## 2019-05-01 NOTE — Therapy (Signed)
[facility] [address], [place], [place], [address] Phone: [phone]   Fax:  [phone]  Physical Therapy Treatment  Patient Details  Name: Shirley Webb MRN: [id number] Date of Birth: Jun 08, 1981 Referring Provider (PT): Mauri Pole, MD   Encounter Date: 05/01/2019  PT End of Session - 05/01/19 1451    Visit Number  13    Date for PT Re-Evaluation  06/07/19    PT Start Time  1447    PT Stop Time  1529    PT Time Calculation (min)  42 min    Activity Tolerance  Patient tolerated treatment well    Behavior During Therapy  [facility] for tasks assessed/performed       Past Medical History:  Diagnosis Date  . Anal fissure   . Anxiety and depression   . Cold hands and feet   . Dyspnea   . Generalized weakness   . GERD (gastroesophageal reflux disease)    occ  . Hemorrhoids   . History of appendicitis   . History of fracture of right ankle   . History of vasculitis    Childhood  . Low back pain   . Migraines   . 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 Wears contact lenses   . Wears glasses   . Wrist fracture    Left    Past Surgical History:  Procedure Laterality Date  . COLONOSCOPY W/ POLYPECTOMY  08/2018  . FRACTURE SURGERY     Ankle fracture  . hardware removal Right    Ankle  . LAPAROSCOPIC APPENDECTOMY N/A 01/03/2017   Procedure: APPENDECTOMY LAPAROSCOPIC;  Surgeon: Armandina Gemma, MD;  Location: [facility];  Service: General;  Laterality: N/A;  . SPHINCTEROTOMY N/A 01/27/2019   Procedure: CHEMICAL SPHINCTEROTOMY BOTOX;  Surgeon: Leighton Ruff, MD;  Location: [facility];  Service: General;  Laterality: N/A;    There were no vitals filed for this visit.  Subjective Assessment - 05/01/19 1744    Subjective  I am sore but it's not the worst day.  My hamstring feels better and overall not having manymuscle spasms.  I still feel like I have to do so much to negotiate my life.     Patient Stated Goals  be able to not have the horrible spasms after a BM so she can return to work soon    Currently in Pain?  Yes    Pain Score  5     Pain Location  Rectum    Pain Orientation  Mid    Pain Descriptors / Indicators  Burning    Pain Type  Chronic pain    Multiple Pain Sites  No                       [facility] Adult PT Treatment/Exercise - 05/01/19 0001      Lumbar Exercises: Standing   Shoulder Extension  Strengthening;Both;20 reps;Theraband    Theraband Level (Shoulder Extension)  Level 3 (Green)    Other Standing Lumbar Exercises  forward arm and LE reaches at wall with core engaged    Other Standing Lumbar Exercises  pallof punch green band - feet parallel and tandem both ways - 20x each      Lumbar Exercises: Quadruped   Single Arm Raise  Right;Left;10 reps   on pball   Straight Leg Raise  10 reps   1.5 lb on ankle - on  pball     Manual Therapy   Soft tissue mobilization  lumbar paraspinals bilat, and bilat glutes               PT Short Term Goals - 04/27/19 1724      PT SHORT TERM GOAL #1   Title  Pt will notice muscle spasms reduced by 25% frequency    Baseline  able to see a difference with how the medicine is working, improved    Status  Partially Met      PT SHORT TERM GOAL #2   Title  Pt will no have cramps that go all the way to her feet due to knowing how to stretch    Status  Achieved        PT Long Term Goals - 05/01/19 1738      PT LONG TERM GOAL #1   Title  Pt will report regular and predictable BM so she can feel comfortable returning to work    Baseline  it has been more predictable but she has altered her eating patterns a lot    Status  On-going      PT LONG TERM GOAL #2   Title  Pt will be confidnet and independent with final HEP so she can continue to promote pelvic floor soft tissue health without increased muscle spasms    Status  On-going      PT LONG TERM GOAL #3   Title  Pt will report muscle  spasm frequency reduced by 50% in order to be able to return to work    Baseline  muscle spasms are much less frequent    Status  On-going      PT LONG TERM GOAL #4   Title  Pt will be able to manage pain more efficiently so she can manage to work a full 8 hour shift    Baseline  currently spends her entire day on pain management and diet    Status  On-going      PT LONG TERM GOAL #5   Title  Pt will report 50% less pain after a BM due to improved ability to control pelvic floor muscles    Status  On-going            Plan - 05/01/19 1734    Clinical Impression Statement  Pt was able to tolerate increase difficulty level of core strengthening exercises today.  She was able to add weight resistance to hip extension.  She was also tolerating more time spent on therex today . Pt continue to demonstrate gains with strength and reduced episodes of pain when at therapy sessions.  pt continues to have significant pain due to healing anal ulcers and will benefit from skilled PT to assist in pain management and improved overall strength and function.    PT Treatment/Interventions  ADLs/Self Care Home Management;Biofeedback;Cryotherapy;Electrical Stimulation;Moist Heat;Therapeutic activities;Therapeutic exercise;Neuromuscular re-education;Patient/family education;Manual techniques;Passive range of motion;Dry needling    PT Next Visit Plan  progress core and pelvic strength, DN and STM as needed    PT Home Exercise Plan  Access Code: [email or web address]    Consulted and Agree with Plan of Care  Patient       Patient will benefit from skilled therapeutic intervention in order to improve the following deficits and impairments:  Increased muscle spasms, Decreased range of motion, Decreased strength, Increased fascial restricitons, Pain  Visit Diagnosis: Cramp and spasm  Chronic left-sided low back pain without sciatica  Muscle weakness (generalized)  Problem List Patient Active Problem List    Diagnosis Date Noted  . Appendicitis, acute 01/03/2017  . Acute appendicitis 01/03/2017  . Paresthesia 03/01/2014  . ANKLE, PAIN 04/04/2008  . NEURALGIA 04/04/2008    Jule Ser, PT 05/01/2019, 5:46 PM  [facility] [address], [place], [place], [address] Phone: [phone]   Fax:  [phone]  Name: Shirley Webb MRN: [id number] Date of Birth: 1980/11/09

## 2019-05-03 ENCOUNTER — Encounter: Payer: Self-pay | Admitting: Physical Therapy

## 2019-05-03 ENCOUNTER — Ambulatory Visit: Payer: BC Managed Care – PPO | Attending: Gastroenterology | Admitting: Physical Therapy

## 2019-05-03 ENCOUNTER — Other Ambulatory Visit: Payer: Self-pay

## 2019-05-03 DIAGNOSIS — M6281 Muscle weakness (generalized): Secondary | ICD-10-CM | POA: Insufficient documentation

## 2019-05-03 DIAGNOSIS — R252 Cramp and spasm: Secondary | ICD-10-CM | POA: Diagnosis not present

## 2019-05-03 DIAGNOSIS — G8929 Other chronic pain: Secondary | ICD-10-CM | POA: Diagnosis not present

## 2019-05-03 DIAGNOSIS — M545 Low back pain, unspecified: Secondary | ICD-10-CM

## 2019-05-03 NOTE — Therapy (Signed)
[facility] [address] [place], [place], [address] Phone: [phone]   Fax:  [phone]  Physical Therapy Treatment  Patient Details  Name: Shirley Webb MRN: [id number] Date of Birth: Sep 19, 1980 Referring Provider (PT): Mauri Pole, MD   Encounter Date: 05/03/2019  PT End of Session - 05/03/19 1359    Visit Number  14    Date for PT Re-Evaluation  06/07/19    PT Start Time  1359    PT Stop Time  1443    PT Time Calculation (min)  44 min    Activity Tolerance  Patient tolerated treatment well    Behavior During Therapy  [facility] for tasks assessed/performed       Past Medical History:  Diagnosis Date  . Anal fissure   . Anxiety and depression   . Cold hands and feet   . Dyspnea   . Generalized weakness   . GERD (gastroesophageal reflux disease)    occ  . Hemorrhoids   . History of appendicitis   . History of fracture of right ankle   . History of vasculitis    Childhood  . Low back pain   . Migraines   . 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 Wears contact lenses   . Wears glasses   . Wrist fracture    Left    Past Surgical History:  Procedure Laterality Date  . COLONOSCOPY W/ POLYPECTOMY  08/2018  . FRACTURE SURGERY     Ankle fracture  . hardware removal Right    Ankle  . LAPAROSCOPIC APPENDECTOMY N/A 01/03/2017   Procedure: APPENDECTOMY LAPAROSCOPIC;  Surgeon: Armandina Gemma, MD;  Location: [facility];  Service: General;  Laterality: N/A;  . SPHINCTEROTOMY N/A 01/27/2019   Procedure: CHEMICAL SPHINCTEROTOMY BOTOX;  Surgeon: Leighton Ruff, MD;  Location: [facility];  Service: General;  Laterality: N/A;    There were no vitals filed for this visit.  Subjective Assessment - 05/03/19 1403    Subjective  Pt states it feels pretty good to stand but sitting hurts today.  Yesterday and day before I had a BM 2x and walked after that then I had some spasms.  I haven't seen any  blood, so that was good.    Patient Stated Goals  be able to not have the horrible spasms after a BM so she can return to work soon    Currently in Pain?  No/denies                       [facility] Adult PT Treatment/Exercise - 05/03/19 0001      Neuro Re-ed    Neuro Re-ed Details   cues to engage core and posutre throughout      Lumbar Exercises: Aerobic   UBE (Upper Arm Bike)  L1 in standing 5x5 fwd/back - keeping a pace where she is needing a breath after each sentence    PT present for status update     Lumbar Exercises: Supine   Bent Knee Raise  20 reps    Other Supine Lumbar Exercises  bent knee fall out - on foam roll      Shoulder Exercises: Standing   Flexion  Strengthening;Both;20 reps   rolling red ball up the wall promote thoracic extension   Extension  Strengthening;Both;20 reps;Theraband    Theraband Level (Shoulder Extension)  Level 1 (Yellow)    Diagonals  Strengthening;Both;15 reps;Theraband  on foam roller - supine   Theraband Level (Shoulder Diagonals)  Level 1 (Yellow)    Other Standing Exercises  20 wall push up with red ball  - 20x    Other Standing Exercises  upper trunk rotation with yellow band               PT Short Term Goals - 04/27/19 1724      PT SHORT TERM GOAL #1   Title  Pt will notice muscle spasms reduced by 25% frequency    Baseline  able to see a difference with how the medicine is working, improved    Status  Partially Met      PT SHORT TERM GOAL #2   Title  Pt will no have cramps that go all the way to her feet due to knowing how to stretch    Status  Achieved        PT Long Term Goals - 05/01/19 1738      PT LONG TERM GOAL #1   Title  Pt will report regular and predictable BM so she can feel comfortable returning to work    Baseline  it has been more predictable but she has altered her eating patterns a lot    Status  On-going      PT LONG TERM GOAL #2   Title  Pt will be confidnet and independent with  final HEP so she can continue to promote pelvic floor soft tissue health without increased muscle spasms    Status  On-going      PT LONG TERM GOAL #3   Title  Pt will report muscle spasm frequency reduced by 50% in order to be able to return to work    Baseline  muscle spasms are much less frequent    Status  On-going      PT LONG TERM GOAL #4   Title  Pt will be able to manage pain more efficiently so she can manage to work a full 8 hour shift    Baseline  currently spends her entire day on pain management and diet    Status  On-going      PT LONG TERM GOAL #5   Title  Pt will report 50% less pain after a BM due to improved ability to control pelvic floor muscles    Status  On-going            Plan - 05/03/19 1446    Clinical Impression Statement  Pt had some soreness but was able to tolerate exercises even though she did have a BM this morning.  Pt continues to make good progress with strengthening.  She tolerated increased difficulty of exercises today.  She tolerated all exercises without any manual therapy needed today.  Pt will benefit from continueing with skilled PT according to POC.    PT Treatment/Interventions  ADLs/Self Care Home Management;Biofeedback;Cryotherapy;Electrical Stimulation;Moist Heat;Therapeutic activities;Therapeutic exercise;Neuromuscular re-education;Patient/family education;Manual techniques;Passive range of motion;Dry needling    PT Next Visit Plan  progress core and pelvic strength, DN and STM as needed    PT Home Exercise Plan  Access Code: [email or web address]    Consulted and Agree with Plan of Care  Patient       Patient will benefit from skilled therapeutic intervention in order to improve the following deficits and impairments:  Increased muscle spasms, Decreased range of motion, Decreased strength, Increased fascial restricitons, Pain  Visit Diagnosis: Cramp and spasm  Chronic left-sided low back pain without sciatica  Muscle weakness  (generalized)     Problem List Patient Active Problem List   Diagnosis Date Noted  . Appendicitis, acute 01/03/2017  . Acute appendicitis 01/03/2017  . Paresthesia 03/01/2014  . ANKLE, PAIN 04/04/2008  . NEURALGIA 04/04/2008    Jule Ser, PT 05/03/2019, 2:51 PM  [facility] [address], [place], [place], [address] Phone: [phone]   Fax:  [phone]  Name: Shirley Webb MRN: [id number] Date of Birth: 31-Dec-1980

## 2019-05-09 ENCOUNTER — Ambulatory Visit: Payer: BC Managed Care – PPO | Admitting: Physical Therapy

## 2019-05-11 ENCOUNTER — Ambulatory Visit: Payer: BC Managed Care – PPO | Admitting: Physical Therapy

## 2019-05-11 ENCOUNTER — Other Ambulatory Visit: Payer: Self-pay

## 2019-05-11 DIAGNOSIS — R252 Cramp and spasm: Secondary | ICD-10-CM | POA: Diagnosis not present

## 2019-05-11 DIAGNOSIS — M545 Low back pain: Secondary | ICD-10-CM | POA: Diagnosis not present

## 2019-05-11 DIAGNOSIS — M6281 Muscle weakness (generalized): Secondary | ICD-10-CM

## 2019-05-11 DIAGNOSIS — G8929 Other chronic pain: Secondary | ICD-10-CM | POA: Diagnosis not present

## 2019-05-11 NOTE — Therapy (Signed)
[facility] [address] [place], [place], [address] Phone: [phone]   Fax:  [phone]  Physical Therapy Treatment  Patient Details  Name: Shirley Webb MRN: [id number] Date of Birth: 1980-11-15 Referring Provider (PT): Mauri Pole, MD   Encounter Date: 05/11/2019  PT End of Session - 05/11/19 1447    Visit Number  15    Date for PT Re-Evaluation  06/07/19    PT Start Time  6962    PT Stop Time  1526    PT Time Calculation (min)  41 min    Activity Tolerance  Patient tolerated treatment well    Behavior During Therapy  [facility] for tasks assessed/performed       Past Medical History:  Diagnosis Date  . Anal fissure   . Anxiety and depression   . Cold hands and feet   . Dyspnea   . Generalized weakness   . GERD (gastroesophageal reflux disease)    occ  . Hemorrhoids   . History of appendicitis   . History of fracture of right ankle   . History of vasculitis    Childhood  . Low back pain   . Migraines   . 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 Wears contact lenses   . Wears glasses   . Wrist fracture    Left    Past Surgical History:  Procedure Laterality Date  . COLONOSCOPY W/ POLYPECTOMY  08/2018  . FRACTURE SURGERY     Ankle fracture  . hardware removal Right    Ankle  . LAPAROSCOPIC APPENDECTOMY N/A 01/03/2017   Procedure: APPENDECTOMY LAPAROSCOPIC;  Surgeon: Armandina Gemma, MD;  Location: [facility];  Service: General;  Laterality: N/A;  . SPHINCTEROTOMY N/A 01/27/2019   Procedure: CHEMICAL SPHINCTEROTOMY BOTOX;  Surgeon: Leighton Ruff, MD;  Location: [facility];  Service: General;  Laterality: N/A;    There were no vitals filed for this visit.  Subjective Assessment - 05/11/19 1447    Subjective  I took it easy the last couple of days.  Monday was a bad day.  It felt like it got better over the last 2 days.  No pain today    Currently in Pain?  No/denies                        [facility] Adult PT Treatment/Exercise - 05/11/19 0001      Lumbar Exercises: Aerobic   UBE (Upper Arm Bike)  L1 in standing 5x5 fwd/back - keeping a pace where she is needing a breath after each sentence    PT present for status update     Lumbar Exercises: Standing   Row  Strengthening;Power tower;Both;20 reps    Row Limitations  20#    Shoulder Extension  Strengthening;Power Tower;Both;20 reps    Shoulder Extension Limitations  15#    Other Standing Lumbar Exercises  hip adduction at the steps - 15x each way yellow band      Lumbar Exercises: Supine   Other Supine Lumbar Exercises   supine on foam roll, knee drop outs, ball overhead, marching LE, clam yellow band, horizontal abd and diagonals yellow             PT Education - 05/11/19 1523    Education Details  Access Code: [email or web address]    Person(s) Educated  Patient    Methods  Explanation;Demonstration;Handout;Verbal cues    Comprehension  Verbalized understanding;Returned demonstration       PT Short Term Goals - 04/27/19 1724      PT SHORT TERM GOAL #1   Title  Pt will notice muscle spasms reduced by 25% frequency    Baseline  able to see a difference with how the medicine is working, improved    Status  Partially Met      PT SHORT TERM GOAL #2   Title  Pt will no have cramps that go all the way to her feet due to knowing how to stretch    Status  Achieved        PT Long Term Goals - 05/11/19 1452      PT LONG TERM GOAL #1   Title  Pt will report regular and predictable BM so she can feel comfortable returning to work    Baseline  improving    Status  On-going      PT LONG TERM GOAL #2   Title  Pt will be confidnet and independent with final HEP so she can continue to promote pelvic floor soft tissue health without increased muscle spasms    Status  On-going            Plan - 05/11/19 1528    Clinical Impression Statement  Pt was able to perform exercises without pain  after treatment today.  Pt has overall improved her ability to manage pain at home.  She became fatigued with exercises today.  She did well with rotation from upper to lower extremity focus.  Mild cues to engage core needed.  Pt will benefit form skilled PT to continue with POC for maximum function while healing from anal ulcers.    PT Treatment/Interventions  ADLs/Self Care Home Management;Biofeedback;Cryotherapy;Electrical Stimulation;Moist Heat;Therapeutic activities;Therapeutic exercise;Neuromuscular re-education;Patient/family education;Manual techniques;Passive range of motion;Dry needling    PT Next Visit Plan  progress core and pelvic strength, DN and STM as needed    PT Home Exercise Plan  Access Code: [email or web address]    Consulted and Agree with Plan of Care  Patient       Patient will benefit from skilled therapeutic intervention in order to improve the following deficits and impairments:  Increased muscle spasms, Decreased range of motion, Decreased strength, Increased fascial restricitons, Pain  Visit Diagnosis: Cramp and spasm  Chronic left-sided low back pain without sciatica  Muscle weakness (generalized)     Problem List Patient Active Problem List   Diagnosis Date Noted  . Appendicitis, acute 01/03/2017  . Acute appendicitis 01/03/2017  . Paresthesia 03/01/2014  . ANKLE, PAIN 04/04/2008  . NEURALGIA 04/04/2008    Jule Ser, PT 05/11/2019, 3:32 PM  [facility] [address], [place], [place], [address] Phone: [phone]   Fax:  [phone]  Name: Shirley Webb MRN: [id number] Date of Birth: Jan 27, 1981

## 2019-05-11 NOTE — Patient Instructions (Signed)
Access Code: [email or web address]  URL: [email or web address]  Date: 05/11/2019  Prepared by: Jari Favre   Exercises  Supine Diaphragmatic Breathing - 10 reps - 1 sets - 3x daily - 7x weekly  Supine Piriformis Stretch - 3 reps - 1 sets - 30 sec hold - 1x daily - 7x weekly  Supine Hamstring Stretch with Strap - 3 reps - 1 sets - 30 sec hold - 1x daily - 7x weekly  Supine Lower Trunk Rotation - 10 reps - 1 sets - 5 sec hold - 1x daily - 7x weekly  Supine Butterfly Groin Stretch - 3 reps - 1 sets - 30 sec hold - 1x daily - 7x weekly  Supine Hip Internal and External Rotation - 10 reps - 1 sets - 5 sec hold - 1x daily - 7x weekly  Quadruped Rocking Backward - 10 reps - 1 sets - 1x daily - 7x weekly  Bent Knee Fallouts - 10 reps - 1 sets - 1x daily - 7x weekly  Supine Transversus Abdominis Bracing with Heel Slide - 10 reps - 1 sets - 1x daily - 7x weekly  Hooklying clam with kegel - 10 reps - 1 sets - 1x daily - 7x weekly

## 2019-05-15 ENCOUNTER — Encounter: Payer: Self-pay | Admitting: Physical Therapy

## 2019-05-15 ENCOUNTER — Ambulatory Visit: Payer: BC Managed Care – PPO | Admitting: Physical Therapy

## 2019-05-15 ENCOUNTER — Other Ambulatory Visit: Payer: Self-pay

## 2019-05-15 DIAGNOSIS — M545 Low back pain: Secondary | ICD-10-CM | POA: Diagnosis not present

## 2019-05-15 DIAGNOSIS — M6281 Muscle weakness (generalized): Secondary | ICD-10-CM

## 2019-05-15 DIAGNOSIS — G8929 Other chronic pain: Secondary | ICD-10-CM | POA: Diagnosis not present

## 2019-05-15 DIAGNOSIS — R252 Cramp and spasm: Secondary | ICD-10-CM | POA: Diagnosis not present

## 2019-05-15 NOTE — Therapy (Signed)
[facility] [address], [place], [place], [address] Phone: [phone]   Fax:  [phone]  Physical Therapy Treatment  Patient Details  Name: Shirley Webb MRN: [id number] Date of Birth: 1981-01-28 Referring Provider (PT): Mauri Pole, MD   Encounter Date: 05/15/2019  PT End of Session - 05/15/19 1727    Visit Number  16    Date for PT Re-Evaluation  06/07/19    PT Start Time  1447    PT Stop Time  1528    PT Time Calculation (min)  41 min    Activity Tolerance  Patient tolerated treatment well    Behavior During Therapy  [facility] for tasks assessed/performed       Past Medical History:  Diagnosis Date  . Anal fissure   . Anxiety and depression   . Cold hands and feet   . Dyspnea   . Generalized weakness   . GERD (gastroesophageal reflux disease)    occ  . Hemorrhoids   . History of appendicitis   . History of fracture of right ankle   . History of vasculitis    Childhood  . Low back pain   . Migraines   . 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 Wears contact lenses   . Wears glasses   . Wrist fracture    Left    Past Surgical History:  Procedure Laterality Date  . COLONOSCOPY W/ POLYPECTOMY  08/2018  . FRACTURE SURGERY     Ankle fracture  . hardware removal Right    Ankle  . LAPAROSCOPIC APPENDECTOMY N/A 01/03/2017   Procedure: APPENDECTOMY LAPAROSCOPIC;  Surgeon: Armandina Gemma, MD;  Location: [facility];  Service: General;  Laterality: N/A;  . SPHINCTEROTOMY N/A 01/27/2019   Procedure: CHEMICAL SPHINCTEROTOMY BOTOX;  Surgeon: Leighton Ruff, MD;  Location: [facility];  Service: General;  Laterality: N/A;    There were no vitals filed for this visit.  Subjective Assessment - 05/15/19 1725    Subjective  Feeling good today.    Patient Stated Goals  be able to not have the horrible spasms after a BM so she can return to work soon    Currently in Pain?  No/denies                        [facility] Adult PT Treatment/Exercise - 05/15/19 0001      Lumbar Exercises: Aerobic   UBE (Upper Arm Bike)  L2.5 in standing 3x3 fwd/back - keeping a pace where she is needing a breath after each sentence    PT present for status update     Lumbar Exercises: Standing   Other Standing Lumbar Exercises  hip abduction and ext - 2# standing on foam - 20x each way      Lumbar Exercises: Supine   Other Supine Lumbar Exercises  thoracic extension foam roller    Other Supine Lumbar Exercises  supine pec stretch, green band ext, horizontal abduction, diagonals - 20x               PT Short Term Goals - 04/27/19 1724      PT SHORT TERM GOAL #1   Title  Pt will notice muscle spasms reduced by 25% frequency    Baseline  able to see a difference with how the medicine is working, improved    Status  Partially Met      PT SHORT  TERM GOAL #2   Title  Pt will no have cramps that go all the way to her feet due to knowing how to stretch    Status  Achieved        PT Long Term Goals - 05/15/19 1501      PT LONG TERM GOAL #1   Title  Pt will report regular and predictable BM so she can feel comfortable returning to work    Baseline  has been able to keep it more in the late afternoon with eating later      PT LONG TERM GOAL #2   Title  Pt will be confidnet and independent with final HEP so she can continue to promote pelvic floor soft tissue health without increased muscle spasms    Status  On-going      PT LONG TERM GOAL #3   Title  Pt will report muscle spasm frequency reduced by 50% in order to be able to return to work    Baseline  94% gone and it is very localized    Status  Achieved      PT LONG TERM GOAL #5   Title  Pt will report 50% less pain after a BM due to improved ability to control pelvic floor muscles    Baseline  maybe 60% better, still having to ice and use anti-spasm medicine (patient reports she is not sure it feels like that  everyday    Status  Partially Met            Plan - 05/15/19 1728    Clinical Impression Statement  Pt tolerated treatment well .  No complaints of pain after treatment today.  She needed reminders to prevent rounding of shoulders during the exercises.  Pt will benefit from skilled PT to continue working on improved strength and ability to control pelvic floor for return to work.    PT Treatment/Interventions  ADLs/Self Care Home Management;Biofeedback;Cryotherapy;Electrical Stimulation;Moist Heat;Therapeutic activities;Therapeutic exercise;Neuromuscular re-education;Patient/family education;Manual techniques;Passive range of motion;Dry needling    PT Next Visit Plan  discuss STM vaginally, progress core and pelvic strength, DN and STM as needed    PT Home Exercise Plan  Access Code: [email or web address]    Recommended Other Services  cert signed    Consulted and Agree with Plan of Care  Patient       Patient will benefit from skilled therapeutic intervention in order to improve the following deficits and impairments:  Increased muscle spasms, Decreased range of motion, Decreased strength, Increased fascial restricitons, Pain  Visit Diagnosis: Cramp and spasm  Chronic left-sided low back pain without sciatica  Muscle weakness (generalized)     Problem List Patient Active Problem List   Diagnosis Date Noted  . Appendicitis, acute 01/03/2017  . Acute appendicitis 01/03/2017  . Paresthesia 03/01/2014  . ANKLE, PAIN 04/04/2008  . NEURALGIA 04/04/2008    Jule Ser, PT 05/15/2019, 5:32 PM  [facility] [address] [place], [place], [address] Phone: [phone]   Fax:  [phone]  Name: Shirley Webb MRN: [id number] Date of Birth: 25-Aug-1981

## 2019-05-17 ENCOUNTER — Other Ambulatory Visit: Payer: Self-pay

## 2019-05-17 ENCOUNTER — Ambulatory Visit: Payer: BC Managed Care – PPO | Admitting: Physical Therapy

## 2019-05-17 DIAGNOSIS — R252 Cramp and spasm: Secondary | ICD-10-CM

## 2019-05-17 DIAGNOSIS — M6281 Muscle weakness (generalized): Secondary | ICD-10-CM | POA: Diagnosis not present

## 2019-05-17 DIAGNOSIS — G8929 Other chronic pain: Secondary | ICD-10-CM

## 2019-05-17 DIAGNOSIS — M545 Low back pain, unspecified: Secondary | ICD-10-CM

## 2019-05-17 NOTE — Therapy (Signed)
[facility] [address] [place], [place], [address] Phone: [phone]   Fax:  [phone]  Physical Therapy Treatment  Patient Details  Name: Shirley Webb MRN: [id number] Date of Birth: 07/08/1981 Referring Provider (PT): Mauri Pole, MD   Encounter Date: 05/17/2019  PT End of Session - 05/17/19 1455    Visit Number  17    Date for PT Re-Evaluation  06/07/19    PT Start Time  1450    PT Stop Time  1530    PT Time Calculation (min)  40 min    Activity Tolerance  Patient tolerated treatment well    Behavior During Therapy  [facility] for tasks assessed/performed       Past Medical History:  Diagnosis Date  . Anal fissure   . Anxiety and depression   . Cold hands and feet   . Dyspnea   . Generalized weakness   . GERD (gastroesophageal reflux disease)    occ  . Hemorrhoids   . History of appendicitis   . History of fracture of right ankle   . History of vasculitis    Childhood  . Low back pain   . Migraines   . 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 Wears contact lenses   . Wears glasses   . Wrist fracture    Left    Past Surgical History:  Procedure Laterality Date  . COLONOSCOPY W/ POLYPECTOMY  08/2018  . FRACTURE SURGERY     Ankle fracture  . hardware removal Right    Ankle  . LAPAROSCOPIC APPENDECTOMY N/A 01/03/2017   Procedure: APPENDECTOMY LAPAROSCOPIC;  Surgeon: Armandina Gemma, MD;  Location: [facility];  Service: General;  Laterality: N/A;  . SPHINCTEROTOMY N/A 01/27/2019   Procedure: CHEMICAL SPHINCTEROTOMY BOTOX;  Surgeon: Leighton Ruff, MD;  Location: [facility];  Service: General;  Laterality: N/A;    There were no vitals filed for this visit.  Subjective Assessment - 05/17/19 1516    Subjective  Pt states she is feeling good today.  Had a bad day yesterday but was still able to see her sister and nephew.  Pt was able to get relief yesterday instead of being in  extreme pain all day.    Patient Stated Goals  be able to not have the horrible spasms after a BM so she can return to work soon    Currently in Pain?  No/denies                       [facility] Adult PT Treatment/Exercise - 05/17/19 0001      Self-Care   Self-Care  Other Self-Care Comments    Other Self-Care Comments   discussed questions to ask the MD on pre-op phone call, discussed self massage or internal vaginal soft tissue release       Lumbar Exercises: Aerobic   UBE (Upper Arm Bike)  L2.5 in standing 3x3 fwd/back - keeping a pace where she is needing a breath after each sentence    PT present for status update     Lumbar Exercises: Standing   Other Standing Lumbar Exercises  sliders - front back and side 10 x each way    Other Standing Lumbar Exercises  side step hold green band for anti-rotation               PT Short Term Goals - 04/27/19 1724  PT SHORT TERM GOAL #1   Title  Pt will notice muscle spasms reduced by 25% frequency    Baseline  able to see a difference with how the medicine is working, improved    Status  Partially Met      PT SHORT TERM GOAL #2   Title  Pt will no have cramps that go all the way to her feet due to knowing how to stretch    Status  Achieved        PT Long Term Goals - 05/15/19 1501      PT LONG TERM GOAL #1   Title  Pt will report regular and predictable BM so she can feel comfortable returning to work    Baseline  has been able to keep it more in the late afternoon with eating later      PT LONG TERM GOAL #2   Title  Pt will be confidnet and independent with final HEP so she can continue to promote pelvic floor soft tissue health without increased muscle spasms    Status  On-going      PT LONG TERM GOAL #3   Title  Pt will report muscle spasm frequency reduced by 50% in order to be able to return to work    Baseline  94% gone and it is very localized    Status  Achieved      PT LONG TERM GOAL #5   Title   Pt will report 50% less pain after a BM due to improved ability to control pelvic floor muscles    Baseline  maybe 60% better, still having to ice and use anti-spasm medicine (patient reports she is not sure it feels like that everyday    Status  Partially Met            Plan - 05/17/19 1457    Clinical Impression Statement  Pt tolerated exercises well today.  She had a bad day yesterday but reports she was able to get more relief in different positions.  Pt was able to recover more quickly and do a lot during today's session.  No increased pain with exercises.  Pt will benefit from skilled PT to progress core strength and have reduced pain with BM.    PT Treatment/Interventions  ADLs/Self Care Home Management;Biofeedback;Cryotherapy;Electrical Stimulation;Moist Heat;Therapeutic activities;Therapeutic exercise;Neuromuscular re-education;Patient/family education;Manual techniques;Passive range of motion;Dry needling    PT Next Visit Plan  discuss STM vaginally, progress core and pelvic strength, DN and STM as needed    PT Home Exercise Plan  Access Code: [email or web address]    Consulted and Agree with Plan of Care  Patient       Patient will benefit from skilled therapeutic intervention in order to improve the following deficits and impairments:  Increased muscle spasms, Decreased range of motion, Decreased strength, Increased fascial restricitons, Pain  Visit Diagnosis: Cramp and spasm  Chronic left-sided low back pain without sciatica  Muscle weakness (generalized)     Problem List Patient Active Problem List   Diagnosis Date Noted  . Appendicitis, acute 01/03/2017  . Acute appendicitis 01/03/2017  . Paresthesia 03/01/2014  . ANKLE, PAIN 04/04/2008  . NEURALGIA 04/04/2008    Jule Ser, PT 05/17/2019, 4:02 PM  [facility] [address] [place], [place], [address] Phone: [phone]   Fax:  [phone]  Name:  Shirley Webb MRN: [id number] Date of Birth: 06-Sep-1980

## 2019-05-18 DIAGNOSIS — Z20828 Contact with and (suspected) exposure to other viral communicable diseases: Secondary | ICD-10-CM | POA: Diagnosis not present

## 2019-05-18 DIAGNOSIS — K602 Anal fissure, unspecified: Secondary | ICD-10-CM | POA: Diagnosis not present

## 2019-05-18 DIAGNOSIS — Z01812 Encounter for preprocedural laboratory examination: Secondary | ICD-10-CM | POA: Diagnosis not present

## 2019-05-22 DIAGNOSIS — K602 Anal fissure, unspecified: Secondary | ICD-10-CM | POA: Diagnosis not present

## 2019-05-23 ENCOUNTER — Encounter: Payer: BC Managed Care – PPO | Admitting: Physical Therapy

## 2019-05-25 DIAGNOSIS — K602 Anal fissure, unspecified: Secondary | ICD-10-CM | POA: Diagnosis not present

## 2019-05-25 DIAGNOSIS — K620 Anal polyp: Secondary | ICD-10-CM | POA: Diagnosis not present

## 2019-05-25 DIAGNOSIS — K6289 Other specified diseases of anus and rectum: Secondary | ICD-10-CM | POA: Diagnosis not present

## 2019-05-25 DIAGNOSIS — K644 Residual hemorrhoidal skin tags: Secondary | ICD-10-CM | POA: Diagnosis not present

## 2019-05-25 DIAGNOSIS — K601 Chronic anal fissure: Secondary | ICD-10-CM | POA: Diagnosis not present

## 2019-05-25 DIAGNOSIS — D129 Benign neoplasm of anus and anal canal: Secondary | ICD-10-CM | POA: Diagnosis not present

## 2019-06-23 DIAGNOSIS — F900 Attention-deficit hyperactivity disorder, predominantly inattentive type: Secondary | ICD-10-CM | POA: Diagnosis not present

## 2019-06-23 DIAGNOSIS — Z0000 Encounter for general adult medical examination without abnormal findings: Secondary | ICD-10-CM | POA: Diagnosis not present

## 2019-06-23 DIAGNOSIS — Z23 Encounter for immunization: Secondary | ICD-10-CM | POA: Diagnosis not present

## 2019-06-23 DIAGNOSIS — E559 Vitamin D deficiency, unspecified: Secondary | ICD-10-CM | POA: Diagnosis not present

## 2019-06-23 DIAGNOSIS — R7989 Other specified abnormal findings of blood chemistry: Secondary | ICD-10-CM | POA: Diagnosis not present

## 2019-06-30 ENCOUNTER — Encounter: Payer: Self-pay | Admitting: Physical Therapy

## 2019-06-30 ENCOUNTER — Ambulatory Visit: Payer: BC Managed Care – PPO | Attending: Gastroenterology | Admitting: Physical Therapy

## 2019-06-30 ENCOUNTER — Other Ambulatory Visit: Payer: Self-pay

## 2019-06-30 DIAGNOSIS — M545 Low back pain, unspecified: Secondary | ICD-10-CM

## 2019-06-30 DIAGNOSIS — G8929 Other chronic pain: Secondary | ICD-10-CM | POA: Insufficient documentation

## 2019-06-30 DIAGNOSIS — M6281 Muscle weakness (generalized): Secondary | ICD-10-CM | POA: Insufficient documentation

## 2019-06-30 DIAGNOSIS — R252 Cramp and spasm: Secondary | ICD-10-CM | POA: Diagnosis not present

## 2019-07-03 NOTE — Addendum Note (Signed)
Addended by: Su Hoff on: 07/03/2019 04:07 PM   Modules accepted: Orders

## 2019-07-03 NOTE — Therapy (Addendum)
[facility] [address] [place], [place], [address] Phone: [phone]   Fax:  [phone]  Physical Therapy Evaluation  Patient Details  Name: Shirley Webb MRN: [id number] Date of Birth: 12-13-1980 Referring Provider (PT): Harmon Dun, FNP   Encounter Date: 06/30/2019  PT End of Session - 07/03/19 0834    Visit Number  1    Date for PT Re-Evaluation  08/25/19    PT Start Time  1020    PT Stop Time  1100    PT Time Calculation (min)  40 min    Activity Tolerance  Patient tolerated treatment well    Behavior During Therapy  [facility] for tasks assessed/performed       Past Medical History:  Diagnosis Date  . Anal fissure   . Anxiety and depression   . Cold hands and feet   . Dyspnea   . Generalized weakness   . GERD (gastroesophageal reflux disease)    occ  . Hemorrhoids   . History of appendicitis   . History of fracture of right ankle   . History of vasculitis    Childhood  . Low back pain   . Migraines   . 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 Wears contact lenses   . Wears glasses   . Wrist fracture    Left    Past Surgical History:  Procedure Laterality Date  . COLONOSCOPY W/ POLYPECTOMY  08/2018  . FRACTURE SURGERY     Ankle fracture  . hardware removal Right    Ankle  . LAPAROSCOPIC APPENDECTOMY N/A 01/03/2017   Procedure: APPENDECTOMY LAPAROSCOPIC;  Surgeon: Armandina Gemma, MD;  Location: [facility];  Service: General;  Laterality: N/A;  . SPHINCTEROTOMY N/A 01/27/2019   Procedure: CHEMICAL SPHINCTEROTOMY BOTOX;  Surgeon: Leighton Ruff, MD;  Location: [facility];  Service: General;  Laterality: N/A;    There were no vitals filed for this visit.   Subjective Assessment - 07/03/19 1057    Subjective  Pt had extra skin removed from the fissure and based on what the surgeon told her, this should help her heal faster.  Pt states the fissure is "big" according to the  surgeon, but that there are no ulcers which is good.  Pt states there has been an imprvement since the last week.    How long can you walk comfortably?  10 min, feels burning and chaffing    Currently in Pain?  Yes    Pain Score  4     Pain Location  Rectum    Pain Orientation  Lower;Mid    Pain Descriptors / Indicators  Burning    Pain Type  Chronic pain    Pain Onset  More than a month ago    Pain Frequency  Intermittent    Aggravating Factors   BM and sitting up in chair, walking too much or too fast    Multiple Pain Sites  No         [facility] PT Assessment - 07/03/19 0001      Assessment   Medical Diagnosis  K62.89 (ICD-10-CM) - Other specified diseases of anus and rectum    Referring Provider (PT)  Harmon Dun, FNP    Onset Date/Surgical Date  05/26/19   removal of extra skin from fissure   Prior Therapy  Yes, but not since surgery      Precautions   Precautions  None  Restrictions   Weight Bearing Restrictions  No      Home Film/video editor residence    Living Arrangements  Spouse/significant other      Prior Function   Level of Independence  Independent    Vocation  On disability      Cognition   Overall Cognitive Status  Within Functional Limits for tasks assessed      Observation/Other Assessments   Observations  Pt sits on Rt side      Posture/Postural Control   Posture/Postural Control  Postural limitations    Posture Comments  sits on side and switches side to side      Strength   Overall Strength Comments  bilateral hip ext 4/5      Palpation   Palpation comment  tight lumbar paraspinals and gluteals , no TTP      Ambulation/Gait   Gait Pattern  Decreased stride length   fearful of too much movement and slow cadence               Objective measurements completed on examination: See above findings.    Pelvic Floor Special Questions - 07/03/19 0001    Prior Pelvic/Prostate Exam  Yes    Are you  Pregnant or attempting pregnancy?  No    Currently Sexually Active  No   not due to healing fissures   Urinary Leakage  No    Fecal incontinence  Yes   only if BM is urgent maybe on    Falling out feeling (prolapse)  No    Skin Integrity  Intact    Perineal Body/Introitus   Normal    Prolapse  None    Pelvic Floor Internal Exam  Pt identity confirmed and informed consent given to perform internal assessment    Exam Type  Vaginal    Palpation  Transverse peroneus is tight and TTP bilateral    Strength  weak squeeze, no lift    Strength # of seconds  4                 PT Short Term Goals - 07/03/19 0954      PT SHORT TERM GOAL #1   Title  Pt will be able to walk 50% faster on her walks in order to return to normal work related activities    Time  4    Period  Weeks    Status  New        PT Long Term Goals - 06/30/19 1033      PT LONG TERM GOAL #1   Title  Pt will be able to have bowel movement without pain that prevents her from doing typical house hold activities afterwards    Time  8    Period  Weeks    Status  New    Target Date  08/25/19      PT LONG TERM GOAL #2   Title  Pt will be confidnet and independent with final HEP so she can continue to promote pelvic floor soft tissue health without increased muscle spasms    Time  8    Period  Weeks    Status  New    Target Date  08/25/19      PT LONG TERM GOAL #3   Title  Pt will be able to walk for 45 minutes without being limited afterwards    Time  8    Period  Weeks  Status  New    Target Date  08/25/19      PT LONG TERM GOAL #4   Title  Pt will be able to manage pain more efficiently so she can manage returning to a half day of work    Baseline  currently can walk for 10-15 at a very slow pace    Time  8    Period  Weeks    Status  New    Target Date  08/25/19             Plan - 07/03/19 0957    Clinical Impression Statement  Pt returns to skilled PT after a rectal evaluation under  aneasthesia.  Pt was able to tolerate pelvic floor assessment vaginally today.  She is doing much better with reduced pain since her previous episode.  Pt is TTP to transverse peroneus. She still has some hip and gluteal weakness and gait impairments as mentioned above.  Pt will benefit from skilled PT to address impairments and work on endurance improved for return to work.    Comorbidities  chronic pain, anal fissure that is not healed    Examination-Activity Limitations  Toileting;Sit;Stand;Locomotion Level    Stability/Clinical Decision Making  Evolving/Moderate complexity    Clinical Decision Making  Moderate    Rehab Potential  Excellent    PT Frequency  1x / week    PT Duration  8 weeks    PT Treatment/Interventions  ADLs/Self Care Home Management;Biofeedback;Cryotherapy;Electrical Stimulation;Moist Heat;Therapeutic activities;Therapeutic exercise;Neuromuscular re-education;Patient/family education;Manual techniques;Passive range of motion;Dry needling    PT Next Visit Plan  f/u on internal STM, f/u on walking, increased strength and endurance    PT Home Exercise Plan  Access Code: [email or web address]    Consulted and Agree with Plan of Care  Patient       Patient will benefit from skilled therapeutic intervention in order to improve the following deficits and impairments:  Increased muscle spasms, Decreased range of motion, Decreased strength, Increased fascial restricitons, Pain  Visit Diagnosis: Cramp and spasm - Plan: PT plan of care cert/re-cert  Chronic left-sided low back pain without sciatica - Plan: PT plan of care cert/re-cert  Muscle weakness (generalized) - Plan: PT plan of care cert/re-cert     Problem List Patient Active Problem List   Diagnosis Date Noted  . Appendicitis, acute 01/03/2017  . Acute appendicitis 01/03/2017  . Paresthesia 03/01/2014  . ANKLE, PAIN 04/04/2008  . NEURALGIA 04/04/2008    Jule Ser, PT 07/03/2019, 4:04 PM  [facility] [address], [place], [place], [address] Phone: [phone]   Fax:  [phone]  Name: BIRDELLA NASSIF MRN: [id number] Date of Birth: February 23, 1981

## 2019-07-03 NOTE — Addendum Note (Signed)
Addended by: Jari Favre L on: 07/03/2019 11:00 AM   Modules accepted: Orders

## 2019-07-06 ENCOUNTER — Encounter: Payer: Self-pay | Admitting: Physical Therapy

## 2019-07-06 ENCOUNTER — Other Ambulatory Visit: Payer: Self-pay

## 2019-07-06 ENCOUNTER — Ambulatory Visit: Payer: BC Managed Care – PPO | Attending: Gastroenterology | Admitting: Physical Therapy

## 2019-07-06 ENCOUNTER — Ambulatory Visit: Payer: BC Managed Care – PPO | Admitting: Physical Therapy

## 2019-07-06 DIAGNOSIS — M6281 Muscle weakness (generalized): Secondary | ICD-10-CM

## 2019-07-06 DIAGNOSIS — M545 Low back pain: Secondary | ICD-10-CM | POA: Insufficient documentation

## 2019-07-06 DIAGNOSIS — R252 Cramp and spasm: Secondary | ICD-10-CM | POA: Diagnosis not present

## 2019-07-06 DIAGNOSIS — G8929 Other chronic pain: Secondary | ICD-10-CM

## 2019-07-06 NOTE — Therapy (Signed)
[facility] [address] [place], [place], [address] Phone: [phone]   Fax:  [phone]  Physical Therapy Treatment  Patient Details  Name: Shirley Webb MRN: [id number] Date of Birth: 1981-04-02 Referring Provider (PT): Harmon Dun, FNP   Encounter Date: 07/06/2019  PT End of Session - 07/06/19 1103    Visit Number  2    Date for PT Re-Evaluation  08/25/19    PT Start Time  1103    PT Stop Time  1143    PT Time Calculation (min)  40 min    Activity Tolerance  Patient tolerated treatment well    Behavior During Therapy  [facility] for tasks assessed/performed       Past Medical History:  Diagnosis Date  . Anal fissure   . Anxiety and depression   . Cold hands and feet   . Dyspnea   . Generalized weakness   . GERD (gastroesophageal reflux disease)    occ  . Hemorrhoids   . History of appendicitis   . History of fracture of right ankle   . History of vasculitis    Childhood  . Low back pain   . Migraines   . 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 Wears contact lenses   . Wears glasses   . Wrist fracture    Left    Past Surgical History:  Procedure Laterality Date  . COLONOSCOPY W/ POLYPECTOMY  08/2018  . FRACTURE SURGERY     Ankle fracture  . hardware removal Right    Ankle  . LAPAROSCOPIC APPENDECTOMY N/A 01/03/2017   Procedure: APPENDECTOMY LAPAROSCOPIC;  Surgeon: Armandina Gemma, MD;  Location: [facility];  Service: General;  Laterality: N/A;  . SPHINCTEROTOMY N/A 01/27/2019   Procedure: CHEMICAL SPHINCTEROTOMY BOTOX;  Surgeon: Leighton Ruff, MD;  Location: [facility];  Service: General;  Laterality: N/A;    There were no vitals filed for this visit.  Subjective Assessment - 07/06/19 1104    Subjective  Pt states she did something that seemed like it was connected to the stabbing pain.  Pt states she is having low level throbbing in the typical spots.  Had to lay down  and relax because of bad days early this weeks.    Pain Score  2     Pain Location  Rectum    Pain Orientation  Left    Pain Descriptors / Indicators  Throbbing    Pain Type  Chronic pain    Multiple Pain Sites  No                       [facility] Adult PT Treatment/Exercise - 07/06/19 0001      Lumbar Exercises: Supine   Other Supine Lumbar Exercises  LE extension with blue band - 15x each side - had sharp pain on Lt side on final rep      Lumbar Exercises: Quadruped   [place]/Old Horse  5 reps    Other Quadruped Lumbar Exercises  child pose - 30 sec    Other Quadruped Lumbar Exercises  donkey kicks - 5x each side      Manual Therapy   Manual Therapy  Soft tissue mobilization    Soft tissue mobilization  Left gluteals and lumbar paraspinals, lumbar multifidi               PT Short Term Goals - 07/06/19 1242  PT SHORT TERM GOAL #1   Title  Pt will be able to walk 50% faster on her walks in order to return to normal work related activities    Status  On-going        PT [place] - 06/30/19 1033      PT LONG TERM GOAL #1   Title  Pt will be able to have bowel movement without pain that prevents her from doing typical house hold activities afterwards    Time  8    Period  Weeks    Status  New    Target Date  08/25/19      PT LONG TERM GOAL #2   Title  Pt will be confidnet and independent with final HEP so she can continue to promote pelvic floor soft tissue health without increased muscle spasms    Time  8    Period  Weeks    Status  New    Target Date  08/25/19      PT LONG TERM GOAL #3   Title  Pt will be able to walk for 45 minutes without being limited afterwards    Time  8    Period  Weeks    Status  New    Target Date  08/25/19      PT LONG TERM GOAL #4   Title  Pt will be able to manage pain more efficiently so she can manage returning to a half day of work    Baseline  currently can walk for 10-15 at a very slow pace    Time   8    Period  Weeks    Status  New    Target Date  08/25/19            Plan - 07/06/19 1244    Clinical Impression Statement  Pt was more sore today.  Pt tolerated exercises okay with some minor spasms.  Pt felt improvement after STM to Lt gluteals and low back. Pt had very crunchy muscle knots throughout lumbar paraspinals and multifidi that became soft and pliable after treatment. She will benefit from skilled PT to continue to porgress strength for improved function and activity level and control of pelvic floor spasms and pain.    PT Treatment/Interventions  ADLs/Self Care Home Management;Biofeedback;Cryotherapy;Electrical Stimulation;Moist Heat;Therapeutic activities;Therapeutic exercise;Neuromuscular re-education;Patient/family education;Manual techniques;Passive range of motion;Dry needling    PT Next Visit Plan  f/u on internal STM, f/u on walking, increased strength and endurance, glutes and core strength    PT Home Exercise Plan  Access Code: [email or web address]    Recommended Other Services  re-cert signed    Consulted and Agree with Plan of Care  Patient       Patient will benefit from skilled therapeutic intervention in order to improve the following deficits and impairments:  Increased muscle spasms, Decreased range of motion, Decreased strength, Increased fascial restricitons, Pain  Visit Diagnosis: Cramp and spasm  Chronic left-sided low back pain without sciatica  Muscle weakness (generalized)     Problem List Patient Active Problem List   Diagnosis Date Noted  . Appendicitis, acute 01/03/2017  . Acute appendicitis 01/03/2017  . Paresthesia 03/01/2014  . ANKLE, PAIN 04/04/2008  . NEURALGIA 04/04/2008    Shirley Webb, PT 07/06/2019, 12:49 PM  [facility] [address], [place], [place], [address] Phone: [phone]   Fax:  [phone]  Name: Shirley Webb MRN: [id number] Date of Birth:  01/27/1981   

## 2019-07-14 ENCOUNTER — Encounter: Payer: Self-pay | Admitting: Physical Therapy

## 2019-07-14 ENCOUNTER — Ambulatory Visit: Payer: BC Managed Care – PPO | Admitting: Physical Therapy

## 2019-07-14 ENCOUNTER — Other Ambulatory Visit: Payer: Self-pay

## 2019-07-14 DIAGNOSIS — M6281 Muscle weakness (generalized): Secondary | ICD-10-CM | POA: Diagnosis not present

## 2019-07-14 DIAGNOSIS — M545 Low back pain, unspecified: Secondary | ICD-10-CM

## 2019-07-14 DIAGNOSIS — R252 Cramp and spasm: Secondary | ICD-10-CM

## 2019-07-14 DIAGNOSIS — G8929 Other chronic pain: Secondary | ICD-10-CM | POA: Diagnosis not present

## 2019-07-14 NOTE — Therapy (Signed)
[facility] [address], [place], [place], [address] Phone: [phone]   Fax:  [phone]  Physical Therapy Treatment  Patient Details  Name: Shirley Webb MRN: [id number] Date of Birth: April 25, 1981 Referring Provider (PT): Harmon Dun, FNP   Encounter Date: 07/14/2019  PT End of Session - 07/14/19 1034    Visit Number  3    Date for PT Re-Evaluation  08/25/19    PT Start Time  1020    PT Stop Time  1100    PT Time Calculation (min)  40 min    Activity Tolerance  Patient tolerated treatment well    Behavior During Therapy  [facility] for tasks assessed/performed       Past Medical History:  Diagnosis Date  . Anal fissure   . Anxiety and depression   . Cold hands and feet   . Dyspnea   . Generalized weakness   . GERD (gastroesophageal reflux disease)    occ  . Hemorrhoids   . History of appendicitis   . History of fracture of right ankle   . History of vasculitis    Childhood  . Low back pain   . Migraines   . 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 Wears contact lenses   . Wears glasses   . Wrist fracture    Left    Past Surgical History:  Procedure Laterality Date  . COLONOSCOPY W/ POLYPECTOMY  08/2018  . FRACTURE SURGERY     Ankle fracture  . hardware removal Right    Ankle  . LAPAROSCOPIC APPENDECTOMY N/A 01/03/2017   Procedure: APPENDECTOMY LAPAROSCOPIC;  Surgeon: Armandina Gemma, MD;  Location: [facility];  Service: General;  Laterality: N/A;  . SPHINCTEROTOMY N/A 01/27/2019   Procedure: CHEMICAL SPHINCTEROTOMY BOTOX;  Surgeon: Leighton Ruff, MD;  Location: [facility];  Service: General;  Laterality: N/A;    There were no vitals filed for this visit.  Subjective Assessment - 07/14/19 1022    Subjective  Had a set back last week from Friday to about Wednesday.  She hasn't gone to the bathroom yet today.  States she feels better but anxiety and nerves are more on edge  today.    Currently in Pain?  Yes    Pain Score  1    when walking or sitting right on it   Pain Location  Rectum    Pain Orientation  Left;Right    Pain Descriptors / Indicators  Throbbing    Multiple Pain Sites  No                                 PT Short Term Goals - 07/06/19 1242      PT SHORT TERM GOAL #1   Title  Pt will be able to walk 50% faster on her walks in order to return to normal work related activities    Status  On-going        PT Long Term Goals - 06/30/19 1033      PT LONG TERM GOAL #1   Title  Pt will be able to have bowel movement without pain that prevents her from doing typical house hold activities afterwards    Time  8    Period  Weeks    Status  New    Target Date  08/25/19  PT LONG TERM GOAL #2   Title  Pt will be confidnet and independent with final HEP so she can continue to promote pelvic floor soft tissue health without increased muscle spasms    Time  8    Period  Weeks    Status  New    Target Date  08/25/19      PT LONG TERM GOAL #3   Title  Pt will be able to walk for 45 minutes without being limited afterwards    Time  8    Period  Weeks    Status  New    Target Date  08/25/19      PT LONG TERM GOAL #4   Title  Pt will be able to manage pain more efficiently so she can manage returning to a half day of work    Baseline  currently can walk for 10-15 at a very slow pace    Time  8    Period  Weeks    Status  New    Target Date  08/25/19            Plan - 07/14/19 1109    Clinical Impression Statement  Today's session used biofeedback which helped with being able to see the response of the pelvic floor to exercises she has been doing. Pt had elevated tone after performing exericses.  She is able to get resting tone lower with stretches. Lying down she has very low <51mV resting tone. Pt will benefit from skilled PT to support soft tissue healing and improve muscle activity and coordination of  pelvic floor.    PT Treatment/Interventions  ADLs/Self Care Home Management;Biofeedback;Cryotherapy;Electrical Stimulation;Moist Heat;Therapeutic activities;Therapeutic exercise;Neuromuscular re-education;Patient/family education;Manual techniques;Passive range of motion;Dry needling    PT Next Visit Plan  f/u on visit with MD, strengthening, biofeedback if needed, discuss POC and spacing out visits to every other week    PT Home Exercise Plan  Access Code: [email or web address]    Consulted and Agree with Plan of Care  Patient       Patient will benefit from skilled therapeutic intervention in order to improve the following deficits and impairments:  Increased muscle spasms, Decreased range of motion, Decreased strength, Increased fascial restricitons, Pain  Visit Diagnosis: Cramp and spasm  Chronic left-sided low back pain without sciatica  Muscle weakness (generalized)     Problem List Patient Active Problem List   Diagnosis Date Noted  . Appendicitis, acute 01/03/2017  . Acute appendicitis 01/03/2017  . Paresthesia 03/01/2014  . ANKLE, PAIN 04/04/2008  . NEURALGIA 04/04/2008    Jule Ser, PT 07/14/2019, 12:11 PM  [facility] [address] [place], [place], [address] Phone: [phone]   Fax:  [phone]  Name: Shirley Webb MRN: [id number] Date of Birth: 10-25-1980

## 2019-07-21 ENCOUNTER — Ambulatory Visit: Payer: BC Managed Care – PPO | Admitting: Physical Therapy

## 2019-07-21 ENCOUNTER — Encounter: Payer: Self-pay | Admitting: Physical Therapy

## 2019-07-21 ENCOUNTER — Other Ambulatory Visit: Payer: Self-pay

## 2019-07-21 DIAGNOSIS — G8929 Other chronic pain: Secondary | ICD-10-CM

## 2019-07-21 DIAGNOSIS — M6281 Muscle weakness (generalized): Secondary | ICD-10-CM | POA: Diagnosis not present

## 2019-07-21 DIAGNOSIS — R252 Cramp and spasm: Secondary | ICD-10-CM

## 2019-07-21 DIAGNOSIS — M545 Low back pain: Secondary | ICD-10-CM | POA: Diagnosis not present

## 2019-07-24 NOTE — Therapy (Addendum)
[facility] [address] [place], [place], [address] Phone: [phone]   Fax:  [phone]  Physical Therapy Treatment  Patient Details  Name: Shirley Webb MRN: [id number] Date of Birth: April 06, 1981 Referring Provider (PT): Harmon Dun, FNP   Encounter Date: 07/21/2019  PT End of Session - 07/30/19 1611    Visit Number  4    Date for PT Re-Evaluation  08/25/19    PT Start Time  [id number]    PT Stop Time  1055    PT Time Calculation (min)  40 min    Activity Tolerance  Patient tolerated treatment well    Behavior During Therapy  [facility] for tasks assessed/performed       Past Medical History:  Diagnosis Date  . Anal fissure   . Anxiety and depression   . Cold hands and feet   . Dyspnea   . Generalized weakness   . GERD (gastroesophageal reflux disease)    occ  . Hemorrhoids   . History of appendicitis   . History of fracture of right ankle   . History of vasculitis    Childhood  . Low back pain   . Migraines   . 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 Wears contact lenses   . Wears glasses   . Wrist fracture    Left    Past Surgical History:  Procedure Laterality Date  . COLONOSCOPY W/ POLYPECTOMY  08/2018  . FRACTURE SURGERY     Ankle fracture  . hardware removal Right    Ankle  . LAPAROSCOPIC APPENDECTOMY N/A 01/03/2017   Procedure: APPENDECTOMY LAPAROSCOPIC;  Surgeon: Armandina Gemma, MD;  Location: [facility];  Service: General;  Laterality: N/A;  . SPHINCTEROTOMY N/A 01/27/2019   Procedure: CHEMICAL SPHINCTEROTOMY BOTOX;  Surgeon: Leighton Ruff, MD;  Location: [facility];  Service: General;  Laterality: N/A;    There were no vitals filed for this visit.  Subjective Assessment - 07/30/19 1612    Subjective  Pt states she had a bad several days.  Pt is doing exercises as able.  Reported that her MD does not want her to do kegels and to keep everything open and relaxed to  maximize healing.    Currently in Pain?  Yes    Pain Score  6     Pain Location  Rectum    Pain Descriptors / Indicators  Sore    Multiple Pain Sites  No                       [facility] Adult PT Treatment/Exercise - 07/30/19 0001      Lumbar Exercises: Stretches   Lower Trunk Rotation Limitations  windshield wipers for IR/ER bilat hip - 10x 5 sec    Press Ups  3 reps;30 seconds      Manual Therapy   Soft tissue mobilization  Left gluteals and lumbar paraspinals, lumbar multifidi       Trigger Point Dry Needling - 07/30/19 0001    Consent Given?  Yes    Education Handout Provided  Previously provided    Gluteus Minimus Response  Twitch response elicited;Palpable increased muscle length    Gluteus Medius Response  Twitch response elicited;Palpable increased muscle length    Tensor Fascia Lata Response  Palpable increased muscle length;Twitch response elicited    Lumbar multifidi Response  Twitch response elicited;Palpable increased muscle length  PT Short Term Goals - 07/06/19 1242      PT SHORT TERM GOAL #1   Title  Pt will be able to walk 50% faster on her walks in order to return to normal work related activities    Status  On-going        PT [place] - 06/30/19 1033      PT LONG TERM GOAL #1   Title  Pt will be able to have bowel movement without pain that prevents her from doing typical house hold activities afterwards    Time  8    Period  Weeks    Status  New    Target Date  08/25/19      PT LONG TERM GOAL #2   Title  Pt will be confidnet and independent with final HEP so she can continue to promote pelvic floor soft tissue health without increased muscle spasms    Time  8    Period  Weeks    Status  New    Target Date  08/25/19      PT LONG TERM GOAL #3   Title  Pt will be able to walk for 45 minutes without being limited afterwards    Time  8    Period  Weeks    Status  New    Target Date  08/25/19      PT LONG  TERM GOAL #4   Title  Pt will be able to manage pain more efficiently so she can manage returning to a half day of work    Baseline  currently can walk for 10-15 at a very slow pace    Time  8    Period  Weeks    Status  New    Target Date  08/25/19            Plan - 07/30/19 1614    Clinical Impression Statement  Today's session focues on release to muscle spasms and stretching for pain management and to allow for muscles to relax.  Pt had no increased pain after session today.  She will benefit from skilled PT to ensure she is able to continue to help her with downtraining of pelvic floor.    PT Treatment/Interventions  ADLs/Self Care Home Management;Biofeedback;Cryotherapy;Electrical Stimulation;Moist Heat;Therapeutic activities;Therapeutic exercise;Neuromuscular re-education;Patient/family education;Manual techniques;Passive range of motion;Dry needling    PT Next Visit Plan  f/u on STM and dry needling, surface EMG biofeedback for downtraining    PT Home Exercise Plan  Access Code: [email or web address]    Consulted and Agree with Plan of Care  Patient       Patient will benefit from skilled therapeutic intervention in order to improve the following deficits and impairments:  Increased muscle spasms, Decreased range of motion, Decreased strength, Increased fascial restricitons, Pain  Visit Diagnosis: Cramp and spasm  Chronic left-sided low back pain without sciatica  Muscle weakness (generalized)     Problem List Patient Active Problem List   Diagnosis Date Noted  . Appendicitis, acute 01/03/2017  . Acute appendicitis 01/03/2017  . Paresthesia 03/01/2014  . ANKLE, PAIN 04/04/2008  . NEURALGIA 04/04/2008    Jule Ser, PT 07/30/2019, 4:18 PM  [facility] [address] [place], [place], [address] Phone: [phone]   Fax:  [phone]  Name: Shirley Webb MRN: [id number] Date of Birth:  1981/06/20

## 2019-08-04 ENCOUNTER — Encounter: Payer: Self-pay | Admitting: Physical Therapy

## 2019-08-04 ENCOUNTER — Other Ambulatory Visit: Payer: Self-pay

## 2019-08-04 ENCOUNTER — Ambulatory Visit: Payer: BC Managed Care – PPO | Attending: Gastroenterology | Admitting: Physical Therapy

## 2019-08-04 DIAGNOSIS — G8929 Other chronic pain: Secondary | ICD-10-CM

## 2019-08-04 DIAGNOSIS — M6281 Muscle weakness (generalized): Secondary | ICD-10-CM | POA: Diagnosis not present

## 2019-08-04 DIAGNOSIS — R252 Cramp and spasm: Secondary | ICD-10-CM | POA: Diagnosis not present

## 2019-08-04 DIAGNOSIS — M545 Low back pain, unspecified: Secondary | ICD-10-CM

## 2019-08-04 NOTE — Therapy (Signed)
[facility] [address] [place], [place], [address] Phone: [phone]   Fax:  [phone]  Physical Therapy Treatment  Patient Details  Name: Shirley Webb MRN: [id number] Date of Birth: [birth year] Referring Provider (PT): Harmon Dun, FNP   Encounter Date: 08/04/2019  PT End of Session - 08/04/19 1100    Visit Number  5    Date for PT Re-Evaluation  08/25/19    PT Start Time  1101    PT Stop Time  1142    PT Time Calculation (min)  41 min    Activity Tolerance  Patient tolerated treatment well    Behavior During Therapy  [facility] for tasks assessed/performed       Past Medical History:  Diagnosis Date  . Anal fissure   . Anxiety and depression   . Cold hands and feet   . Dyspnea   . Generalized weakness   . GERD (gastroesophageal reflux disease)    occ  . Hemorrhoids   . History of appendicitis   . History of fracture of right ankle   . History of vasculitis    Childhood  . Low back pain   . Migraines   . 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 Wears contact lenses   . Wears glasses   . Wrist fracture    Left    Past Surgical History:  Procedure Laterality Date  . COLONOSCOPY W/ POLYPECTOMY  08/2018  . FRACTURE SURGERY     Ankle fracture  . hardware removal Right    Ankle  . LAPAROSCOPIC APPENDECTOMY N/A 01/03/2017   Procedure: APPENDECTOMY LAPAROSCOPIC;  Surgeon: Armandina Gemma, MD;  Location: [facility];  Service: General;  Laterality: N/A;  . SPHINCTEROTOMY N/A 01/27/2019   Procedure: CHEMICAL SPHINCTEROTOMY BOTOX;  Surgeon: Leighton Ruff, MD;  Location: [facility];  Service: General;  Laterality: N/A;    There were no vitals filed for this visit.  Subjective Assessment - 08/05/19 0744    Subjective  Pt states today is not the best but better than last week. Overall, she has been feeling more dicomfort in the tailbone and generally not feeling much improvement.  Pt  has taken a break from doing exercises.    Currently in Pain?  Yes    Pain Score  5     Pain Location  Rectum    Pain Descriptors / Indicators  Sore    Multiple Pain Sites  No                       [facility] Adult PT Treatment/Exercise - 08/05/19 0001      Manual Therapy   Soft tissue mobilization  Right and Left gluteals and lumbar paraspinals, lumbar multifidi               PT Short Term Goals - 07/06/19 1242      PT SHORT TERM GOAL #1   Title  Pt will be able to walk 50% faster on her walks in order to return to normal work related activities    Status  On-going        PT [place] - 06/30/19 [place] #1   Title  Pt will be able to have bowel movement without pain that prevents her from doing typical house hold activities afterwards    Time  8  Period  Weeks    Status  New    Target Date  08/25/19      PT LONG TERM GOAL #2   Title  Pt will be confidnet and independent with final HEP so she can continue to promote pelvic floor soft tissue health without increased muscle spasms    Time  8    Period  Weeks    Status  New    Target Date  08/25/19      PT LONG TERM GOAL #3   Title  Pt will be able to walk for 45 minutes without being limited afterwards    Time  8    Period  Weeks    Status  New    Target Date  08/25/19      PT LONG TERM GOAL #4   Title  Pt will be able to manage pain more efficiently so she can manage returning to a half day of work    Baseline  currently can walk for 10-15 at a very slow pace    Time  8    Period  Weeks    Status  New    Target Date  08/25/19            Plan - 08/05/19 0748    Clinical Impression Statement  Today's session focused on soft tissue release.  Pt is still recovering from a set back several weeks ago.  She will benefit from skilled PT to continue to help with pain management while recovering from surgery and regaining full function of pelvic floor muscles after the  doctor clears her to do kegel exercises again.    PT Treatment/Interventions  ADLs/Self Care Home Management;Biofeedback;Cryotherapy;Electrical Stimulation;Moist Heat;Therapeutic activities;Therapeutic exercise;Neuromuscular re-education;Patient/family education;Manual techniques;Passive range of motion;Dry needling    PT Next Visit Plan  f/u on STM and dry needling, surface EMG biofeedback for downtraining    PT Home Exercise Plan  Access Code: [email or web address]    Consulted and Agree with Plan of Care  Patient       Patient will benefit from skilled therapeutic intervention in order to improve the following deficits and impairments:  Increased muscle spasms, Decreased range of motion, Decreased strength, Increased fascial restricitons, Pain  Visit Diagnosis: Cramp and spasm  Chronic left-sided low back pain without sciatica  Muscle weakness (generalized)     Problem List Patient Active Problem List   Diagnosis Date Noted  . Appendicitis, acute 01/03/2017  . Acute appendicitis 01/03/2017  . Paresthesia 03/01/2014  . ANKLE, PAIN 04/04/2008  . NEURALGIA 04/04/2008    Jule Ser, PT 08/05/2019, 7:53 AM  [facility] [address] [place], [place], [address] Phone: [phone]   Fax:  [phone]  Name: Shirley Webb MRN: [id number] Date of Birth: 05/02/1981

## 2019-08-18 ENCOUNTER — Other Ambulatory Visit: Payer: Self-pay

## 2019-08-18 ENCOUNTER — Ambulatory Visit: Payer: BC Managed Care – PPO | Admitting: Physical Therapy

## 2019-08-18 ENCOUNTER — Encounter: Payer: Self-pay | Admitting: Physical Therapy

## 2019-08-18 DIAGNOSIS — M545 Low back pain: Secondary | ICD-10-CM | POA: Diagnosis not present

## 2019-08-18 DIAGNOSIS — G8929 Other chronic pain: Secondary | ICD-10-CM

## 2019-08-18 DIAGNOSIS — M6281 Muscle weakness (generalized): Secondary | ICD-10-CM

## 2019-08-18 DIAGNOSIS — R252 Cramp and spasm: Secondary | ICD-10-CM

## 2019-08-18 NOTE — Therapy (Addendum)
[facility] [address] [place], [place], [address] Phone: [phone]   Fax:  [phone]  Physical Therapy Treatment  Patient Details  Name: Shirley Webb MRN: [id number] Date of Birth: 1981/04/14 Referring Provider (PT): Harmon Dun, FNP   Encounter Date: 08/18/2019  PT End of Session - 08/18/19 1158    Visit Number  6    Date for PT Re-Evaluation  08/25/19    PT Start Time  1017    PT Stop Time  1101    PT Time Calculation (min)  44 min    Activity Tolerance  Patient tolerated treatment well    Behavior During Therapy  [facility] for tasks assessed/performed       Past Medical History:  Diagnosis Date  . Anal fissure   . Anxiety and depression   . Cold hands and feet   . Dyspnea   . Generalized weakness   . GERD (gastroesophageal reflux disease)    occ  . Hemorrhoids   . History of appendicitis   . History of fracture of right ankle   . History of vasculitis    Childhood  . Low back pain   . Migraines   . 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 Wears contact lenses   . Wears glasses   . Wrist fracture    Left    Past Surgical History:  Procedure Laterality Date  . COLONOSCOPY W/ POLYPECTOMY  08/2018  . FRACTURE SURGERY     Ankle fracture  . hardware removal Right    Ankle  . LAPAROSCOPIC APPENDECTOMY N/A 01/03/2017   Procedure: APPENDECTOMY LAPAROSCOPIC;  Surgeon: Armandina Gemma, MD;  Location: [facility];  Service: General;  Laterality: N/A;  . SPHINCTEROTOMY N/A 01/27/2019   Procedure: CHEMICAL SPHINCTEROTOMY BOTOX;  Surgeon: Leighton Ruff, MD;  Location: [facility];  Service: General;  Laterality: N/A;    There were no vitals filed for this visit.  Subjective Assessment - 08/18/19 1159    Subjective  I am unchanged.  Today is a slightly worse day.  I have had days where I have walked more though so that's an improvement.  Work is fine.  I have to stand most of the  time.    Currently in Pain?  Yes    Pain Score  5     Pain Location  Rectum    Pain Descriptors / Indicators  Sore                       [facility] Adult PT Treatment/Exercise - 08/24/19 0001      Manual Therapy   Soft tissue mobilization  Right and Left gluteals and lumbar paraspinals, lumbar multifidi       Trigger Point Dry Needling - 08/24/19 0001    Lumbar multifidi Response  Twitch response elicited;Palpable increased muscle length             PT Short Term Goals - 07/06/19 1242      PT SHORT TERM GOAL #1   Title  Pt will be able to walk 50% faster on her walks in order to return to normal work related activities    Status  On-going        PT Long Term Goals - 08/24/19 0940      PT LONG TERM GOAL #1   Title  Pt will be able to have bowel movement without pain that prevents  her from doing typical house hold activities afterwards    Baseline  as long as she keeps her diet regular this has been achieved    Status  Achieved      PT LONG TERM GOAL #2   Title  Pt will be confidnet and independent with final HEP so she can continue to promote pelvic floor soft tissue health without increased muscle spasms    Baseline  she is managing symptoms with HEP    Status  Achieved      PT LONG TERM GOAL #3   Title  Pt will be able to walk for 45 minutes without being limited afterwards    Baseline  10-15 minutes    Status  Not Met      PT LONG TERM GOAL #4   Title  Pt will be able to manage pain more efficiently so she can manage returning to a half day of work    Baseline  can work full day currently    Status  Achieved            Plan - 08/24/19 0948    Clinical Impression Statement  Pt has not been able to progress with final walking goal.  She is functional with her HEP at this time and once more healed she may want to return to PT for strengthening.  As of today she is discharged with HEP.    PT Treatment/Interventions  ADLs/Self Care Home  Management;Biofeedback;Cryotherapy;Electrical Stimulation;Moist Heat;Therapeutic activities;Therapeutic exercise;Neuromuscular re-education;Patient/family education;Manual techniques;Passive range of motion;Dry needling    PT Home Exercise Plan  Access Code: [email or web address]    Consulted and Agree with Plan of Care  Patient       Patient will benefit from skilled therapeutic intervention in order to improve the following deficits and impairments:  Increased muscle spasms, Decreased range of motion, Decreased strength, Increased fascial restricitons, Pain  Visit Diagnosis: Cramp and spasm  Chronic left-sided low back pain without sciatica  Muscle weakness (generalized)     Problem List Patient Active Problem List   Diagnosis Date Noted  . Appendicitis, acute 01/03/2017  . Acute appendicitis 01/03/2017  . Paresthesia 03/01/2014  . ANKLE, PAIN 04/04/2008  . NEURALGIA 04/04/2008    Jule Ser, PT 08/24/2019, 9:50 AM  [facility] [address], [place], [place], [address] Phone: [phone]   Fax:  [phone]  Name: Shirley Webb MRN: [id number] Date of Birth: 03/28/1981  PHYSICAL THERAPY DISCHARGE SUMMARY  Visits from Start of Care: 6  Current functional level related to goals / functional outcomes: See above   Remaining deficits: See above   Education / Equipment: ind with HEP  Plan: Patient agrees to discharge.  Patient goals were partially met. Patient is being discharged due to lack of progress.  ?????   Unable to progress further at this time because patient is working on [employer], PT 08/24/19 9:50 AM

## 2019-09-12 DIAGNOSIS — E039 Hypothyroidism, unspecified: Secondary | ICD-10-CM | POA: Diagnosis not present

## 2019-09-13 DIAGNOSIS — K602 Anal fissure, unspecified: Secondary | ICD-10-CM | POA: Diagnosis not present

## 2019-09-30 DIAGNOSIS — Z20822 Contact with and (suspected) exposure to covid-19: Secondary | ICD-10-CM | POA: Diagnosis not present

## 2019-09-30 DIAGNOSIS — Z20828 Contact with and (suspected) exposure to other viral communicable diseases: Secondary | ICD-10-CM | POA: Diagnosis not present

## 2019-10-06 DIAGNOSIS — K601 Chronic anal fissure: Secondary | ICD-10-CM | POA: Diagnosis not present

## 2019-10-06 DIAGNOSIS — Z9889 Other specified postprocedural states: Secondary | ICD-10-CM | POA: Diagnosis not present

## 2019-11-03 DIAGNOSIS — F902 Attention-deficit hyperactivity disorder, combined type: Secondary | ICD-10-CM | POA: Diagnosis not present

## 2019-11-27 DIAGNOSIS — R309 Painful micturition, unspecified: Secondary | ICD-10-CM | POA: Diagnosis not present

## 2020-01-23 DIAGNOSIS — K602 Anal fissure, unspecified: Secondary | ICD-10-CM | POA: Diagnosis not present

## 2020-06-28 DIAGNOSIS — Z20822 Contact with and (suspected) exposure to covid-19: Secondary | ICD-10-CM | POA: Diagnosis not present

## 2020-10-11 DIAGNOSIS — N390 Urinary tract infection, site not specified: Secondary | ICD-10-CM | POA: Diagnosis not present

## 2020-10-11 DIAGNOSIS — E039 Hypothyroidism, unspecified: Secondary | ICD-10-CM | POA: Diagnosis not present

## 2020-10-11 DIAGNOSIS — Z0000 Encounter for general adult medical examination without abnormal findings: Secondary | ICD-10-CM | POA: Diagnosis not present

## 2020-10-11 DIAGNOSIS — E559 Vitamin D deficiency, unspecified: Secondary | ICD-10-CM | POA: Diagnosis not present

## 2020-10-18 DIAGNOSIS — F902 Attention-deficit hyperactivity disorder, combined type: Secondary | ICD-10-CM | POA: Diagnosis not present

## 2020-10-18 DIAGNOSIS — R319 Hematuria, unspecified: Secondary | ICD-10-CM | POA: Diagnosis not present

## 2020-10-18 DIAGNOSIS — Z0000 Encounter for general adult medical examination without abnormal findings: Secondary | ICD-10-CM | POA: Diagnosis not present

## 2020-10-18 DIAGNOSIS — Z23 Encounter for immunization: Secondary | ICD-10-CM | POA: Diagnosis not present

## 2020-10-18 DIAGNOSIS — Z01419 Encounter for gynecological examination (general) (routine) without abnormal findings: Secondary | ICD-10-CM | POA: Diagnosis not present

## 2021-05-26 DIAGNOSIS — Z3046 Encounter for surveillance of implantable subdermal contraceptive: Secondary | ICD-10-CM | POA: Diagnosis not present

## 2021-09-26 DIAGNOSIS — Z3046 Encounter for surveillance of implantable subdermal contraceptive: Secondary | ICD-10-CM | POA: Diagnosis not present

## 2021-09-26 DIAGNOSIS — Z3202 Encounter for pregnancy test, result negative: Secondary | ICD-10-CM | POA: Diagnosis not present

## 2021-10-21 DIAGNOSIS — E559 Vitamin D deficiency, unspecified: Secondary | ICD-10-CM | POA: Diagnosis not present

## 2021-10-21 DIAGNOSIS — Z0000 Encounter for general adult medical examination without abnormal findings: Secondary | ICD-10-CM | POA: Diagnosis not present

## 2021-10-21 DIAGNOSIS — E039 Hypothyroidism, unspecified: Secondary | ICD-10-CM | POA: Diagnosis not present

## 2021-10-21 DIAGNOSIS — R309 Painful micturition, unspecified: Secondary | ICD-10-CM | POA: Diagnosis not present

## 2021-10-24 DIAGNOSIS — Z0000 Encounter for general adult medical examination without abnormal findings: Secondary | ICD-10-CM | POA: Diagnosis not present

## 2021-10-24 DIAGNOSIS — Z1151 Encounter for screening for human papillomavirus (HPV): Secondary | ICD-10-CM | POA: Diagnosis not present

## 2021-10-24 DIAGNOSIS — Z01419 Encounter for gynecological examination (general) (routine) without abnormal findings: Secondary | ICD-10-CM | POA: Diagnosis not present

## 2022-07-20 ENCOUNTER — Other Ambulatory Visit: Payer: Self-pay | Admitting: Registered Nurse

## 2022-07-20 DIAGNOSIS — Z1231 Encounter for screening mammogram for malignant neoplasm of breast: Secondary | ICD-10-CM

## 2022-09-18 ENCOUNTER — Ambulatory Visit
Admission: RE | Admit: 2022-09-18 | Discharge: 2022-09-18 | Disposition: A | Discharge status: Home / Self Care | Payer: Managed Care, Other (non HMO) | Source: Ambulatory Visit | Attending: Registered Nurse | Admitting: Registered Nurse

## 2022-09-18 DIAGNOSIS — Z1231 Encounter for screening mammogram for malignant neoplasm of breast: Secondary | ICD-10-CM

## 2022-09-21 ENCOUNTER — Other Ambulatory Visit: Payer: Self-pay | Admitting: Registered Nurse

## 2022-09-21 DIAGNOSIS — R928 Other abnormal and inconclusive findings on diagnostic imaging of breast: Secondary | ICD-10-CM

## 2022-10-08 ENCOUNTER — Ambulatory Visit
Admission: RE | Admit: 2022-10-08 | Discharge: 2022-10-08 | Disposition: A | Discharge status: Home / Self Care | Payer: Managed Care, Other (non HMO) | Source: Ambulatory Visit | Attending: Registered Nurse | Admitting: Registered Nurse

## 2022-10-08 DIAGNOSIS — R928 Other abnormal and inconclusive findings on diagnostic imaging of breast: Secondary | ICD-10-CM

## 2023-02-04 ENCOUNTER — Telehealth: Payer: Self-pay | Admitting: Gastroenterology

## 2023-02-04 NOTE — Telephone Encounter (Signed)
PT has been experiencing severe stomach pains since Saturday. The pain comes and goes. She can only eat toast and broth and very concerned. She is requesting a callback to discuss options for relief.

## 2023-02-04 NOTE — Telephone Encounter (Addendum)
The pt is calling to report abd pain that has been severe since Sat off /on.  She has not been seen here for 4 years.  I have advised that she should be seen in the ED-[facility] or call her PCP.  She states that she is not willing to proceed with any appts at this time.  She says that she is suffering from PTSD and can not come to the doctor for any appts.  I did explain that she should make a follow up at some point since she is having symptoms now and appts are getting further out.  She again declines but thanked me for calling.

## 2023-02-19 ENCOUNTER — Encounter: Payer: Self-pay | Admitting: Gastroenterology

## 2023-04-27 ENCOUNTER — Ambulatory Visit: Payer: Managed Care, Other (non HMO) | Admitting: Gastroenterology

## 2023-04-27 ENCOUNTER — Encounter: Payer: Self-pay | Admitting: Gastroenterology

## 2023-04-27 VITALS — BP 102/60 | HR 74 | Wt 120.2 lb

## 2023-04-27 DIAGNOSIS — R109 Unspecified abdominal pain: Secondary | ICD-10-CM

## 2023-04-27 DIAGNOSIS — L290 Pruritus ani: Secondary | ICD-10-CM | POA: Diagnosis not present

## 2023-04-27 MED ORDER — NON FORMULARY: 1 refills | Status: AC

## 2023-04-27 NOTE — Patient Instructions (Addendum)
  Your provider has prescribed diltiazem gel for you. Please follow the directions written on your prescription bottle or given to you specifically by your provider. Since this is a specialty medication and is not readily available at most local pharmacies, we have sent your prescription to:  [facility] information is below: Address: [address] [place], [place] [address]  [phone]  *Please DO NOT go directly from our office to pick up this medication! Give the pharmacy 1 day to process the prescription as this is compounded and takes time to make.   Please purchase the following medications over the counter and take as directed: Desitin- found in the baby aisle , use after every bowel movement.  _______________________________________________________  If your blood pressure at your visit was 140/90 or greater, please contact your primary care physician to follow up on this.  _______________________________________________________  If you are age 60 or older, your body mass index should be between 23-30. Your Body mass index is 22.72 kg/m. If this is out of the aforementioned range listed, please consider follow up with your Primary Care Provider.  If you are age 51 or younger, your body mass index should be between 19-25. Your Body mass index is 22.72 kg/m. If this is out of the aformentioned range listed, please consider follow up with your Primary Care Provider.   ________________________________________________________  The [place] providers would like to encourage you to use [facility] to communicate with providers for non-urgent requests or questions.  Due to long hold times on the telephone, sending your provider a message by [facility] may be a faster and more efficient way to get a response.  Please allow 48 business hours for a response.  Please remember that this is for non-urgent requests.  _______________________________________________________ It was  a pleasure to see you today!  Thank you for trusting me with your gastrointestinal care!

## 2023-04-27 NOTE — Progress Notes (Signed)
Chief Complaint: "stomach issues" Primary GI MD: Dr. Lavon Paganini  HPI: 42 year old female history of anxiety, depression, GERD, presents for evaluation of "stomach issues"  Last seen 03/2019. At that time she had recently Underwent chemical sphincterotomy with Botox injection 01/27/2019 by Dr. Maisie Fus. Dr. Lavon Paganini recommended benefiber, miralax, and pelvic floor therapy.  She continues to follow with colorectal surgeon (Dr. Drue Dun) for intermittent anal pain/anal fissure. Patient notes she has PTSD from her experience with her anal fissure and all of the procedures and workup she had to have done related to it so she is hesitant towards doctors and procedures as it brings up trauma for her.  Additionally, as a result she is not interested in getting a repeat colonoscopy anytime in the future.  She is due January 2025 but would like to forego colonoscopy until she feels she is ready to undergo it.  Patient is here today because early June she had a bout of abdominal pain that felt similar to her previous episode of appendicitis.  This was right before her upcoming vacation and she feels it may have been her anxiety related to the vacation but she is unsure.  While she was on vacation she had a recurrence of her abdominal pain that improved when she took her mother-in-law's Carafate.  She has been taking 1 tablet daily with improvement.  She does not have any issues at this time.  Her abdominal pain has gone away and has been gone for a few weeks.  She wanted to keep this appointment to reestablish care with [place].  She is doing well and has gone through therapy and feels a lot of her symptoms may be related to her anxiety because when she thinks too much about them they are more prevalent and when she is distracted and is not thinking about her symptoms they are resolved.  She does report continued anal pruritus.  Recently seen by Dr. Drue Dun for a rectal lesion that was not concerning.  Patient uses a  bidet and will sometimes have rectal bleeding.  She uses steroid cream as needed with relief.   PREVIOUS GI WORKUP   Colonoscopy 08/2018 for GI bleeding - Normal mucosa in the entire examined colon. Biopsied.  - The examined portion of the ileum was normal. Biopsied.  - One 4 mm polyp in the rectum, removed with a cold snare. Resected and retrieved.  - Non- bleeding internal hemorrhoids.  Diagnosis 1. Surgical [P], terminal ileum - BENIGN SMALL BOWEL MUCOSA. - NO DYSPLASIA OR MALIGNANCY. 2. Surgical [P], random colon - BENIGN COLONIC MUCOSA. - NO ACTIVE INFLAMMATION. - NO DYSPLASIA OR MALIGNANCY. 3. Surgical [P], rectal; polyp (1) - TUBULAR ADENOMA. - NO HIGH GRADE DYSPLASIA OR MALIGNANCY.  Past Medical History:  Diagnosis Date   Anal fissure    Anxiety and depression    Cold hands and feet    Dyspnea    Generalized weakness    GERD (gastroesophageal reflux disease)    occ   Hemorrhoids    History of appendicitis    History of fracture of right ankle    History of vasculitis    Childhood   Low back pain    Migraines    Ulnar nerve injury    Left arm and hand   Wears contact lenses    Wears glasses    Wrist fracture    Left    Past Surgical History:  Procedure Laterality Date   COLONOSCOPY W/ POLYPECTOMY  08/2018   FRACTURE SURGERY  Ankle fracture   hardware removal Right    Ankle   LAPAROSCOPIC APPENDECTOMY N/A 01/03/2017   Procedure: APPENDECTOMY LAPAROSCOPIC;  Surgeon: Darnell Level, MD;  Location: [facility];  Service: General;  Laterality: N/A;   SPHINCTEROTOMY N/A 01/27/2019   Procedure: CHEMICAL SPHINCTEROTOMY BOTOX;  Surgeon: Romie Levee, MD;  Location: [facility];  Service: General;  Laterality: N/A;    Current Outpatient Medications  Medication Sig Dispense Refill   baclofen (LIORESAL) 10 MG tablet Take 10 mg by mouth as needed.      Bromelain 100 MG TABS Take 1 tablet by mouth 3 (three) times daily.     chlorzoxazone (PARAFON) 500  MG tablet Take 1 tablet by mouth every 8 (eight) hours as needed.  3   dextroamphetamine (DEXTROSTAT) 5 MG tablet Take 5 mg by mouth 2 (two) times daily.     diphenhydrAMINE (BENADRYL) 25 MG tablet Take 50 mg by mouth every 6 (six) hours as needed for sleep.     ELDERBERRY PO Take 5 mLs by mouth daily.     EMGALITY 120 MG/ML SOAJ Inject into the skin every 30 (thirty) days.  5   Etonogestrel (NEXPLANON [place]) Inject into the skin.     Hyoscyamine Sulfate SL (LEVSIN/SL) 0.125 MG SUBL Place 1 capsule under the tongue 2 (two) times a day. 120 tablet 1   Melatonin-Lemon Balm 3-500 MG-MCG TABS Take 1 tablet by mouth at bedtime.     Multiple Vitamin (MULTIVITAMIN) tablet Take 1 tablet by mouth daily.     Omega-3 Fatty Acids (FISH OIL) 1000 MG CAPS Take 1 capsule by mouth daily.     polyethylene glycol (MIRALAX) 17 g packet Take 17 g by mouth daily.     Probiotic Product (PROBIOTIC-10) CAPS Take 1 capsule by mouth as needed.      Rhodiola rosea (RHODIOLA PO) Take 1 vial by mouth.     rizatriptan (MAXALT) 10 MG tablet Take 1 tablet by mouth daily as needed.  2   Topiramate ER (TROKENDI XR) 100 MG CP24 Take by mouth every evening.     traZODone (DESYREL) 50 MG tablet Take 50-100 mg by mouth at bedtime.      Turmeric (QC TUMERIC COMPLEX) 500 MG CAPS Take 2 capsules by mouth daily.     Wheat Dextrin (BENEFIBER) POWD Take by mouth. 2 teaspoons tid     Current Facility-Administered Medications  Medication Dose Route Frequency Provider Last Rate Last Admin   0.9 %  sodium chloride infusion  500 mL Intravenous Once Napoleon Form, MD        Allergies as of 04/27/2023 - Review Complete 03/02/2019  Allergen Reaction Noted   Sulfamethoxazole-trimethoprim Hives and Rash 05/06/2016   Latex  01/03/2017    Family History  Problem Relation Age of Onset   Crohn's disease Paternal Aunt    Colon cancer Neg Hx    Stomach cancer Neg Hx    Rectal cancer Neg Hx    Esophageal cancer Neg Hx     Social  History   Socioeconomic History   Marital status: Single    Spouse name: Not on file   Number of children: Not on file   Years of education: Not on file   Highest education level: Not on file  Occupational History   Occupation: Optician  Tobacco Use   Smoking status: Former   Smokeless tobacco: Never  Vaping Use   Vaping status: Never Used  Substance and Sexual Activity   Alcohol  use: Yes    Alcohol/week: 1.0 - 2.0 standard drink of alcohol    Types: 1 - 2 Cans of beer per week   Drug use: Yes    Frequency: 2.0 times per week    Types: Marijuana   Sexual activity: Not on file  Other Topics Concern   Not on file  Social History Narrative   Not on file   Social Determinants of Health   Financial Resource Strain: Not on file  Food Insecurity: Not on file  Transportation Needs: Not on file  Physical Activity: Not on file  Stress: Not on file  Social Connections: Unknown (01/03/2022)   Received from [facility]   Social Network    Social Network: Not on file  Intimate Partner Violence: Unknown (12/02/2021)   Received from [employer], [facility]   HITS    Physically Hurt: Not on file    Insult or Talk Down To: Not on file    Threaten Physical Harm: Not on file    Scream or Curse: Not on file    Review of Systems:    Constitutional: No weight loss, fever, chills, weakness or fatigue HEENT: Eyes: No change in vision               Ears, Nose, Throat:  No change in hearing or congestion Skin: No rash or itching Cardiovascular: No chest pain, chest pressure or palpitations   Respiratory: No SOB or cough Gastrointestinal: See HPI and otherwise negative Genitourinary: No dysuria or change in urinary frequency Neurological: No headache, dizziness or syncope Musculoskeletal: No new muscle or joint pain Hematologic: No bleeding or bruising Psychiatric: No history of depression or anxiety    Physical Exam:  Vital signs: There were no vitals taken for  this visit.  Constitutional: NAD, Well developed, Well nourished, alert and cooperative Head:  Normocephalic and atraumatic. Eyes:   PEERL, EOMI. No icterus. Conjunctiva pink. Respiratory: Respirations even and unlabored. Lungs clear to auscultation bilaterally.   No wheezes, crackles, or rhonchi.  Cardiovascular:  Regular rate and rhythm. No peripheral edema, cyanosis or pallor.  Gastrointestinal:  Soft, nondistended, nontender. No rebound or guarding. Normal bowel sounds. No appreciable masses or hepatomegaly. Rectal:  Not performed.  Msk:  Symmetrical without gross deformities. Without edema, no deformity or joint abnormality.  Neurologic:  Alert and  oriented x4;  grossly normal neurologically.  Skin:   Dry and intact without significant lesions or rashes. Psychiatric: Oriented to person, place and time. Demonstrates good judgement and reason without abnormal affect or behaviors.   RELEVANT LABS AND IMAGING: CBC    Component Value Date/Time   WBC 15.4 (H) 01/03/2017 0314   RBC 4.38 01/03/2017 0314   HGB 14.3 08/03/2018 1006   HCT 42.7 08/03/2018 1006   PLT 249 01/03/2017 0314   MCV 91.1 01/03/2017 0314   MCH 30.8 01/03/2017 0314   MCHC 33.8 01/03/2017 0314   RDW 12.8 01/03/2017 0314    CMP     Component Value Date/Time   NA 136 01/03/2017 0314   K 3.8 01/03/2017 0314   CL 104 01/03/2017 0314   CO2 23 01/03/2017 0314   GLUCOSE 128 (H) 01/03/2017 0314   BUN 6 01/03/2017 0314   CREATININE 0.58 01/03/2017 0314   CALCIUM 9.1 01/03/2017 0314   PROT 7.2 01/03/2017 0314   ALBUMIN 4.1 01/03/2017 0314   AST 17 01/03/2017 0314   ALT 13 (L) 01/03/2017 0314   ALKPHOS 50 01/03/2017 0314  BILITOT 0.8 01/03/2017 0314   GFRNONAA >60 01/03/2017 0314   GFRAA >60 01/03/2017 0314     Assessment/Plan:   Anal pruritus Chronic, has seen multiple rectal surgeons and undergone procedures. Worse when she is anxious. Sometimes improves with steroid cream --- use Desitin after every  BM as a barrier cream --- compound cream prn to help with discomfort  Abdominal pain, unspecified abdominal location Resolved. Worse when she has anxiety and improved with carafate. Ddx includes gastritis, esophagitis, PUD. --- if symptoms return please let us know    [facility] 04/27/2023, 8:34 AM  Cc: Fatima Sanger, FNP

## 2024-09-05 ENCOUNTER — Telehealth: Payer: Self-pay

## 2024-09-05 NOTE — Telephone Encounter (Signed)
 Attempted to reach patient concerning colonoscopy recall; unable to speak with patient;  left message and number to the office for patient to call back and schedule appts;

## 2024-09-11 ENCOUNTER — Other Ambulatory Visit: Payer: Self-pay | Admitting: Registered Nurse

## 2024-09-11 DIAGNOSIS — Z1231 Encounter for screening mammogram for malignant neoplasm of breast: Secondary | ICD-10-CM

## 2024-09-22 ENCOUNTER — Ambulatory Visit: Admission: RE | Admit: 2024-09-22 | Source: Ambulatory Visit

## 2024-09-22 DIAGNOSIS — Z1231 Encounter for screening mammogram for malignant neoplasm of breast: Secondary | ICD-10-CM

## 2024-10-03 ENCOUNTER — Other Ambulatory Visit: Payer: Self-pay | Admitting: Registered Nurse

## 2024-10-03 DIAGNOSIS — R928 Other abnormal and inconclusive findings on diagnostic imaging of breast: Secondary | ICD-10-CM

## 2024-10-06 ENCOUNTER — Inpatient Hospital Stay: Admission: RE | Admit: 2024-10-06

## 2024-10-06 DIAGNOSIS — R928 Other abnormal and inconclusive findings on diagnostic imaging of breast: Secondary | ICD-10-CM
